# Patient Record
Sex: Female | Born: 2000 | Race: Black or African American | Hispanic: No | Marital: Single | Smoking: Never smoker
Health system: Southern US, Community
[De-identification: ages and names within clinical notes are randomized; demographics above are authoritative.]

## PROBLEM LIST (undated history)

## (undated) DIAGNOSIS — R569 Unspecified convulsions: Secondary | ICD-10-CM

## (undated) DIAGNOSIS — R599 Enlarged lymph nodes, unspecified: Secondary | ICD-10-CM

## (undated) DIAGNOSIS — D649 Anemia, unspecified: Secondary | ICD-10-CM

## (undated) DIAGNOSIS — F419 Anxiety disorder, unspecified: Secondary | ICD-10-CM

## (undated) DIAGNOSIS — T7840XA Allergy, unspecified, initial encounter: Secondary | ICD-10-CM

## (undated) DIAGNOSIS — C801 Malignant (primary) neoplasm, unspecified: Secondary | ICD-10-CM

## (undated) HISTORY — DX: Anemia, unspecified: D64.9

## (undated) HISTORY — DX: Malignant (primary) neoplasm, unspecified: C80.1

## (undated) HISTORY — DX: Allergy, unspecified, initial encounter: T78.40XA

## (undated) HISTORY — DX: Anxiety disorder, unspecified: F41.9

---
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
## 2018-10-04 DIAGNOSIS — L910 Hypertrophic scar: Secondary | ICD-10-CM | POA: Diagnosis not present

## 2018-11-06 DIAGNOSIS — L910 Hypertrophic scar: Secondary | ICD-10-CM | POA: Diagnosis not present

## 2019-02-28 DIAGNOSIS — Z113 Encounter for screening for infections with a predominantly sexual mode of transmission: Secondary | ICD-10-CM | POA: Diagnosis not present

## 2019-02-28 DIAGNOSIS — Z7189 Other specified counseling: Secondary | ICD-10-CM | POA: Diagnosis not present

## 2019-06-28 ENCOUNTER — Ambulatory Visit: Payer: Self-pay | Attending: Family

## 2019-06-28 DIAGNOSIS — Z23 Encounter for immunization: Secondary | ICD-10-CM

## 2019-06-28 NOTE — Progress Notes (Signed)
   Covid-19 Vaccination Clinic  Name:  NORELL HENNINGSON    MRN: [id number] DOB: 19-Jul-2000  06/28/2019  Ms. Eike was observed post Covid-19 immunization for 15 minutes without incident. She was provided with Vaccine Information Sheet and instruction to access the V-Safe system.   Ms. Balough was instructed to call 911 with any severe reactions post vaccine: Marland Kitchen Difficulty breathing  . Swelling of face and throat  . A fast heartbeat  . A bad rash all over body  . Dizziness and weakness   Immunizations Administered    Name Date Dose VIS Date Route   JANSSEN COVID-19 VACCINE 06/28/2019  1:20 PM 0.5 mL 05/24/2019 Intramuscular   Manufacturer: Alphonsa Overall   Lot: [id number]   [place]: [id number]

## 2019-09-26 DIAGNOSIS — L910 Hypertrophic scar: Secondary | ICD-10-CM | POA: Diagnosis not present

## 2019-11-05 DIAGNOSIS — Z20822 Contact with and (suspected) exposure to covid-19: Secondary | ICD-10-CM | POA: Diagnosis not present

## 2019-11-07 DIAGNOSIS — L910 Hypertrophic scar: Secondary | ICD-10-CM | POA: Diagnosis not present

## 2020-02-05 DIAGNOSIS — H6981 Other specified disorders of Eustachian tube, right ear: Secondary | ICD-10-CM | POA: Diagnosis not present

## 2020-02-05 DIAGNOSIS — H918X3 Other specified hearing loss, bilateral: Secondary | ICD-10-CM | POA: Diagnosis not present

## 2020-02-05 DIAGNOSIS — H938X3 Other specified disorders of ear, bilateral: Secondary | ICD-10-CM | POA: Diagnosis not present

## 2020-02-06 ENCOUNTER — Ambulatory Visit: Payer: Self-pay | Attending: Family

## 2020-02-06 DIAGNOSIS — Z23 Encounter for immunization: Secondary | ICD-10-CM

## 2020-04-14 DIAGNOSIS — Z20822 Contact with and (suspected) exposure to covid-19: Secondary | ICD-10-CM | POA: Diagnosis not present

## 2020-05-06 NOTE — Progress Notes (Signed)
   Covid-19 Vaccination Clinic  Name:  NOREENE BOREMAN    MRN: [id number] DOB: 2001/03/11  05/06/2020  Ms. Thakur was observed post Covid-19 immunization for 15 minutes without incident. She was provided with Vaccine Information Sheet and instruction to access the V-Safe system.   Ms. Hunsucker was instructed to call 911 with any severe reactions post vaccine: Marland Kitchen Difficulty breathing  . Swelling of face and throat  . A fast heartbeat  . A bad rash all over body  . Dizziness and weakness   Immunizations Administered    Name Date Dose VIS Date Route   Moderna COVID-19 Vaccine 02/06/2020 10:40 AM 0.5 mL 01/14/2020 Intramuscular   Manufacturer: Moderna   Lot: [id number]   [place]: [phone]

## 2020-08-19 ENCOUNTER — Ambulatory Visit: Payer: BC Managed Care – PPO | Admitting: Family Medicine

## 2020-08-19 ENCOUNTER — Encounter: Payer: Self-pay | Admitting: Family Medicine

## 2020-08-19 ENCOUNTER — Telehealth: Payer: Self-pay

## 2020-08-19 ENCOUNTER — Other Ambulatory Visit: Payer: Self-pay

## 2020-08-19 VITALS — BP 128/76 | HR 95 | Temp 97.3°F | Ht 67.0 in | Wt 170.4 lb

## 2020-08-19 DIAGNOSIS — R1030 Lower abdominal pain, unspecified: Secondary | ICD-10-CM | POA: Diagnosis not present

## 2020-08-19 DIAGNOSIS — D508 Other iron deficiency anemias: Secondary | ICD-10-CM | POA: Diagnosis not present

## 2020-08-19 DIAGNOSIS — Z8579 Personal history of other malignant neoplasms of lymphoid, hematopoietic and related tissues: Secondary | ICD-10-CM | POA: Diagnosis not present

## 2020-08-19 DIAGNOSIS — D509 Iron deficiency anemia, unspecified: Secondary | ICD-10-CM

## 2020-08-19 DIAGNOSIS — Z0000 Encounter for general adult medical examination without abnormal findings: Secondary | ICD-10-CM

## 2020-08-19 DIAGNOSIS — C8140 Lymphocyte-rich classical Hodgkin lymphoma, unspecified site: Secondary | ICD-10-CM

## 2020-08-19 DIAGNOSIS — Z8572 Personal history of non-Hodgkin lymphomas: Secondary | ICD-10-CM

## 2020-08-19 LAB — URINALYSIS, ROUTINE W REFLEX MICROSCOPIC
Bilirubin Urine: NEGATIVE
Hgb urine dipstick: NEGATIVE
Ketones, ur: NEGATIVE
Nitrite: NEGATIVE
Specific Gravity, Urine: 1.015 (ref 1.000–1.030)
Total Protein, Urine: NEGATIVE
Urine Glucose: NEGATIVE
Urobilinogen, UA: 0.2 (ref 0.0–1.0)
pH: 7 (ref 5.0–8.0)

## 2020-08-19 MED ORDER — PANTOPRAZOLE SODIUM 20 MG PO TBEC: 20.0000 mg | DELAYED_RELEASE_TABLET | Freq: Every day | ORAL | 0 refills | Status: DC

## 2020-08-19 NOTE — Telephone Encounter (Signed)
Patient's mother is calling in wanting to know what all is going on with her stomach issues, Anna Roman has filled out a DPR that authorizes [place] to be able to speak with mom. Mom would really like a call back today to go over this information so she understands why she is needing this medication.

## 2020-08-19 NOTE — Progress Notes (Addendum)
Established Patient Office Visit  Subjective:  Patient ID: Anna Roman, female    DOB: [birth year]  Age: 20 y.o. MRN: [id number]  CC:  Chief Complaint  Patient presents with  . Establish Care    NP/establish care concerns about stomach issues with first meal of the day. Per patient she will have major stomach pains she will sometimes have to vomit to relieve the pain symptoms x 5 years.     HPI [name] presents for history of experiencing left upper to left mid abdominal pain with her morning meal.  Occurrences been intermittent but she says more often than not.  She takes Tylenol before her meal because of the pain.  Pain is been present since she completed therapy for Hodgkin's lymphoma back in 2012.  Cannot really identify reflux of burning up into the chest.  Denies increased burp.  Denies weight loss or night sweats.  Denies that certain foods help or exacerbate.  She is not sexually active. She is a Equities trader at [employer]. Sometimes feels a little sad with current life events.   Past Medical History:  Diagnosis Date  . Allergy   . Anemia   . Lymph node enlargement    right side neck  . Seizures ([place])    x 1 as a reaction to medication; also hx. absence seizures    Past Surgical History:  Procedure Laterality Date  . lymphoma  04/15/2010   Nodular lymphocyte predominant hodgkin's lymphoma  . MASS BIOPSY  02/14/2011   Procedure: NECK MASS BIOPSY;  Surgeon: Rozetta Nunnery, MD;  Location: [place];  Service: ENT;  Laterality: Right;  excision right neck node     Family History  Problem Relation Age of Onset  . Heart disease Maternal Grandfather   . Hypertension Mother   . Hypertension Paternal Grandmother   . Diabetes Paternal Grandmother   . Hypertension Father   . Asthma Maternal Uncle   . Diabetes Maternal Grandmother   . Hypertension Maternal Grandmother     Social History   Socioeconomic History  . Marital status: Single    Spouse  name: Not on file  . Number of children: Not on file  . Years of education: Not on file  . Highest education level: Not on file  Occupational History  . Not on file  Tobacco Use  . Smoking status: Never Smoker  . Smokeless tobacco: Never Used  Vaping Use  . Vaping Use: Some days  Substance and Sexual Activity  . Alcohol use: Yes    Alcohol/week: 1.0 standard drink    Types: 1 Glasses of wine per week    Comment: social  . Drug use: No  . Sexual activity: Yes    Birth control/protection: None  Other Topics Concern  . Not on file  Social History Narrative  . Not on file   Social Determinants of Health   Financial Resource Strain: Not on file  Food Insecurity: Not on file  Transportation Needs: Not on file  Physical Activity: Not on file  Stress: Not on file  Social Connections: Not on file  Intimate Partner Violence: Not on file    Outpatient Medications Prior to Visit  Medication Sig Dispense Refill  . acetaminophen (TYLENOL) 325 MG tablet Take 650 mg by mouth every 6 (six) hours as needed.    . loratadine (CLARITIN) 10 MG tablet Take by mouth.    Marland Kitchen ibuprofen (ADVIL,MOTRIN) 400 MG tablet Take by mouth. (Patient not  taking: Reported on 08/19/2020)    . Multiple Vitamin (THERA) TABS Take by mouth. (Patient not taking: Reported on 08/19/2020)     No facility-administered medications prior to visit.    Allergies  Allergen Reactions  . Codeine Nausea And Vomiting    Other reaction(s): Confusion  . Lactose Intolerance (Gi) Other (See Comments)    Constipation, GI upset  . Azithromycin Hives    ROS Review of Systems  Constitutional: Negative for chills, diaphoresis, fatigue, fever and unexpected weight change.  HENT: Negative.   Eyes: Negative for photophobia and visual disturbance.  Respiratory: Negative.   Cardiovascular: Negative.   Gastrointestinal: Positive for abdominal pain and constipation. Negative for anal bleeding, blood in stool, diarrhea, nausea, rectal  pain and vomiting.  Endocrine: Negative for polyphagia and polyuria.  Genitourinary: Negative for difficulty urinating, dysuria and vaginal discharge.  Musculoskeletal: Negative for gait problem and joint swelling.  Skin: Negative for color change and pallor.  Neurological: Negative for speech difficulty and weakness.  Hematological: Does not bruise/bleed easily.   Depression screen [facility] 2/9 08/19/2020 08/19/2020 06/01/2017  Decreased Interest 1 0 0  Down, Depressed, Hopeless 1 1 0  PHQ - 2 Score 2 1 0  Altered sleeping 1 - -  Tired, decreased energy 0 - -  Change in appetite 0 - -  Feeling bad or failure about yourself  0 - -  Trouble concentrating 2 - -  Moving slowly or fidgety/restless 0 - -  Suicidal thoughts 0 - -  PHQ-9 Score 5 - -  Difficult doing work/chores Not difficult at all - -      Objective:    Physical Exam Vitals and nursing note reviewed.  Constitutional:      General: She is not in acute distress.    Appearance: Normal appearance. She is normal weight. She is not ill-appearing, toxic-appearing or diaphoretic.  HENT:     Head: Normocephalic and atraumatic.     Right Ear: Tympanic membrane, ear canal and external ear normal.     Left Ear: Tympanic membrane, ear canal and external ear normal.     Mouth/Throat:     Mouth: Mucous membranes are moist.     Pharynx: Oropharynx is clear. No oropharyngeal exudate or posterior oropharyngeal erythema.  Eyes:     General: No scleral icterus.       Right eye: No discharge.        Left eye: No discharge.     Extraocular Movements: Extraocular movements intact.     Conjunctiva/sclera: Conjunctivae normal.     Pupils: Pupils are equal, round, and reactive to light.  Neck:     Vascular: No carotid bruit.  Cardiovascular:     Rate and Rhythm: Normal rate and regular rhythm.  Pulmonary:     Effort: Pulmonary effort is normal.     Breath sounds: Normal breath sounds.  Abdominal:     General: Abdomen is flat. Bowel  sounds are normal. There is no distension.     Palpations: There is no mass.     Tenderness: There is no abdominal tenderness. There is no guarding or rebound.     Hernia: No hernia is present.  Musculoskeletal:     Cervical back: Neck supple. No rigidity or tenderness.  Lymphadenopathy:     Cervical: No cervical adenopathy.  Neurological:     General: No focal deficit present.     Mental Status: She is alert and oriented to person, place, and time.  Psychiatric:  Mood and Affect: Mood normal.     BP 128/76   Pulse 95   Temp (!) 97.3 F (36.3 C) (Temporal)   Ht 5\' 7"  (1.702 m)   Wt 170 lb 6.4 oz (77.3 kg)   SpO2 100%   BMI 26.69 kg/m  Wt Readings from Last 3 Encounters:  08/19/20 170 lb 6.4 oz (77.3 kg)  06/01/17 138 lb 9.6 oz (62.9 kg) (71 %, Z= 0.55)*  06/28/16 147 lb 6.4 oz (66.9 kg) (83 %, Z= 0.94)*   * Growth percentiles are based on CDC (Girls, 2-20 Years) data.     Health Maintenance Due  Topic Date Due  . HIV Screening  Never done  . HPV VACCINES (3 - 2-dose series) 10/30/2013  . Hepatitis C Screening  Never done  . PAP-Cervical Cytology Screening  Never done  . PAP SMEAR-Modifier  Never done  . TETANUS/TDAP  03/27/2020       Topic Date Due  . HPV VACCINES (3 - 2-dose series) 10/30/2013    Lab Results  Component Value Date   TSH 1.16 08/20/2020   Lab Results  Component Value Date   WBC 5.1 08/20/2020   HGB 11.1 (L) 08/20/2020   HCT 34.5 (L) 08/20/2020   MCV 68.5 Repeated and verified X2. (L) 08/20/2020   PLT 261.0 08/20/2020   Lab Results  Component Value Date   NA 136 08/20/2020   K 4.0 08/20/2020   CO2 24 08/20/2020   GLUCOSE 87 08/20/2020   BUN 10 08/20/2020   CREATININE 0.62 08/20/2020   BILITOT 0.7 08/20/2020   ALKPHOS 50 08/20/2020   AST 15 08/20/2020   ALT 9 08/20/2020   PROT 7.1 08/20/2020   ALBUMIN 4.7 08/20/2020   CALCIUM 10.0 08/20/2020   GFR 126.77 08/20/2020   No results found for: CHOL No results found for:  HDL No results found for: LDLCALC No results found for: TRIG No results found for: CHOLHDL No results found for: HGBA1C    Assessment & Plan:   Problem List Items Addressed This Visit      Other   Iron deficiency anemia secondary to inadequate dietary iron intake - Primary   Relevant Medications   ferrous sulfate 220 (44 Fe) MG/5ML solution   History of lymphoma   Relevant Orders   CBC w/Diff (Completed)   Lower abdominal pain   Relevant Medications   pantoprazole (PROTONIX) 20 MG tablet   Other Relevant Orders   US Abdomen Complete   Amylase (Completed)   CBC w/Diff (Completed)   Comprehensive metabolic panel (Completed)   Urinalysis, Routine w reflex microscopic (Completed)   Healthcare maintenance   Relevant Orders   LDL cholesterol, direct (Completed)      Meds ordered this encounter  Medications  . pantoprazole (PROTONIX) 20 MG tablet    Sig: Take 1 tablet (20 mg total) by mouth daily.    Dispense:  90 tablet    Refill:  0  . ferrous sulfate 220 (44 Fe) MG/5ML solution    Sig: Take 5 mLs (220 mg total) by mouth daily.    Dispense:  150 mL    Refill:  3    Follow-up: Return in about 6 weeks (around 09/30/2020).   Trial of daily Protonix over the next 6 weeks.  Have ordered abdominal ultrasound.  She will follow-up in 6 weeks. Not interested in pursuing treatment for her sadness at this time.  Libby Maw, MD

## 2020-08-19 NOTE — Addendum Note (Signed)
Addended by: Beryle Lathe S on: 08/19/2020 02:08 PM   Modules accepted: Orders

## 2020-08-20 ENCOUNTER — Other Ambulatory Visit: Payer: Self-pay

## 2020-08-20 ENCOUNTER — Other Ambulatory Visit (INDEPENDENT_AMBULATORY_CARE_PROVIDER_SITE_OTHER): Payer: BC Managed Care – PPO

## 2020-08-20 DIAGNOSIS — Z8579 Personal history of other malignant neoplasms of lymphoid, hematopoietic and related tissues: Secondary | ICD-10-CM | POA: Diagnosis not present

## 2020-08-20 DIAGNOSIS — Z0000 Encounter for general adult medical examination without abnormal findings: Secondary | ICD-10-CM

## 2020-08-20 DIAGNOSIS — D509 Iron deficiency anemia, unspecified: Secondary | ICD-10-CM | POA: Diagnosis not present

## 2020-08-20 DIAGNOSIS — R1030 Lower abdominal pain, unspecified: Secondary | ICD-10-CM

## 2020-08-20 LAB — COMPREHENSIVE METABOLIC PANEL
ALT: 9 U/L (ref 0–35)
AST: 15 U/L (ref 0–37)
Albumin: 4.7 g/dL (ref 3.5–5.2)
Alkaline Phosphatase: 50 U/L (ref 39–117)
BUN: 10 mg/dL (ref 6–23)
CO2: 24 mEq/L (ref 19–32)
Calcium: 10 mg/dL (ref 8.4–10.5)
Chloride: 102 mEq/L (ref 96–112)
Creatinine, Ser: 0.62 mg/dL (ref 0.40–1.20)
GFR: 126.77 mL/min (ref >60.00)
Glucose, Bld: 87 mg/dL (ref 70–99)
Potassium: 4 mEq/L (ref 3.5–5.1)
Sodium: 136 mEq/L (ref 135–145)
Total Bilirubin: 0.7 mg/dL (ref 0.2–1.2)
Total Protein: 7.1 g/dL (ref 6.0–8.3)

## 2020-08-20 LAB — CBC WITH DIFFERENTIAL/PLATELET
Basophils Absolute: 0 10*3/uL (ref 0.0–0.1)
Basophils Relative: 0.9 % (ref 0.0–3.0)
Eosinophils Absolute: 0.1 10*3/uL (ref 0.0–0.7)
Eosinophils Relative: 1.4 % (ref 0.0–5.0)
HCT: 34.5 % — ABNORMAL LOW (ref 36.0–46.0)
Hemoglobin: 11.1 g/dL — ABNORMAL LOW (ref 12.0–15.0)
Lymphocytes Relative: 37.9 % (ref 12.0–46.0)
Lymphs Abs: 1.9 10*3/uL (ref 0.7–4.0)
MCHC: 32.2 g/dL (ref 30.0–36.0)
MCV: 68.5 fl — ABNORMAL LOW (ref 78.0–100.0)
Monocytes Absolute: 0.5 10*3/uL (ref 0.1–1.0)
Monocytes Relative: 9.3 % (ref 3.0–12.0)
Neutro Abs: 2.6 10*3/uL (ref 1.4–7.7)
Neutrophils Relative %: 50.5 % (ref 43.0–77.0)
Platelets: 261 10*3/uL (ref 150.0–400.0)
RBC: 5.08 Mil/uL (ref 3.87–5.11)
RDW: 18.8 % — ABNORMAL HIGH (ref 11.5–15.5)
WBC: 5.1 10*3/uL (ref 4.0–10.5)

## 2020-08-20 LAB — AMYLASE: Amylase: 61 U/L (ref 27–131)

## 2020-08-20 LAB — TSH: TSH: 1.16 u[IU]/mL (ref 0.35–4.50)

## 2020-08-20 LAB — LDL CHOLESTEROL, DIRECT: Direct LDL: 102 mg/dL

## 2020-08-20 NOTE — Progress Notes (Signed)
Per orders of Dr. Ethelene Hal pt is here for labs pt tolerated draw well.

## 2020-08-21 LAB — IRON,TIBC AND FERRITIN PANEL
%SAT: 7 % (calc) — ABNORMAL LOW (ref 16–45)
Ferritin: 4 ng/mL — ABNORMAL LOW (ref 16–154)
Iron: 33 ug/dL — ABNORMAL LOW (ref 40–190)
TIBC: 478 mcg/dL (calc) — ABNORMAL HIGH (ref 250–450)

## 2020-08-24 DIAGNOSIS — Z0000 Encounter for general adult medical examination without abnormal findings: Secondary | ICD-10-CM | POA: Insufficient documentation

## 2020-08-24 MED ORDER — FERROUS SULFATE 220 (44 FE) MG/5ML PO ELIX: 220.0000 mg | ORAL_SOLUTION | Freq: Every day | ORAL | 3 refills | Status: AC

## 2020-08-24 NOTE — Addendum Note (Signed)
Addended by: Jon Billings on: 08/24/2020 07:38 AM   Modules accepted: Orders

## 2020-08-31 ENCOUNTER — Telehealth: Payer: Self-pay | Admitting: Family Medicine

## 2020-08-31 NOTE — Telephone Encounter (Signed)
I received a message from [place] that pt had not called back to schedule US Abdomen complete. I called pt to ask if she still wanted exam. Pt gave phone to her mother, [name].   [name] states she has called office 2x (see 1 msg below) asking for someone to call back.   [name] states that pt had cancer and was treated at [facility] in [place] from 2011-2015. She doesn't want to look at 1 body part at a time. She is requesting full body MRI to be ordered at [facility] in [name].  [name] would like call back today.  Ray,Anna Roman (Mother) [phone] Jerilynn Mages)

## 2020-08-31 NOTE — Telephone Encounter (Signed)
We don't really do "full body Mris" Perhaps the best thing to do would be for me to refer her back to her oncologist for a check up.

## 2020-08-31 NOTE — Telephone Encounter (Signed)
error 

## 2020-09-01 NOTE — Telephone Encounter (Signed)
Please advise message below mother requesting referral with oncologist that works for [facility] in [place]. Okay for referral?

## 2020-09-01 NOTE — Telephone Encounter (Signed)
Spoke with patients mother regarding referral for oncologist per Audrea Muscat patient was diagnosed with Non Hodkins lymphoma at 20 years of age. Patients mother wanted to also inform Dr. Ethelene Hal and let you know that patient was still taking medications that were prescribed at her last visit. Mother asked if she needed to continue taking Protonix I checked last note and seen that the recommendation was to take for 6 weeks and come back for follow up (appointment scheduled) mother aware that patient should take meds until next visit.

## 2020-09-05 NOTE — Addendum Note (Signed)
Addended by: Jon Billings on: 09/05/2020 09:35 AM   Modules accepted: Orders

## 2020-09-07 ENCOUNTER — Encounter: Payer: Self-pay | Admitting: *Deleted

## 2020-09-07 ENCOUNTER — Telehealth: Payer: Self-pay | Admitting: Family Medicine

## 2020-09-07 NOTE — Progress Notes (Signed)
Patient has a history of pediatric Hodgkins Lymphoma. She has not followed up with her oncologist for several years, and is now an adult and needs a new oncologist. She lives with her mother, who is on her ROI.   Attempted to reach patient at 11:00am. Voicemail left requesting call back. Patient called back at 1:35p.  Reached out to [employer] to introduce myself as the office RN Navigator and explain our new patient process. Reviewed the reason for their referral and scheduled their new patient appointment along with labs. Provided address and directions to the office including call back phone number. Reviewed with patient any concerns they may have or any possible barriers to attending their appointment.   Informed patient about my role as a navigator and that I will meet with them prior to their New Patient appointment and more fully discuss what services I can provide. At this time patient has no further questions or needs.    Oncology Nurse Navigator Documentation  Oncology Nurse Navigator Flowsheets 09/07/2020  Diagnosis Status Confirmed Diagnosis Complete  Navigator Follow Up Date: 09/15/2020  Navigator Follow Up Reason: New Patient Appointment  Navigator Location [facility]  Referral Date to RadOnc/MedOnc 09/06/2020  Navigator Encounter Type Introductory Phone Call  Patient Visit Type MedOnc  Treatment Phase Post-Tx Follow-up  Barriers/Navigation Needs Coordination of Care;Education  Education Other  Interventions Coordination of Care;Education  Acuity Level 2-Minimal Needs (1-2 Barriers Identified)  Coordination of Care Appts  Education Method Other  Support Groups/Services Friends and Family  Time Spent with Patient 36

## 2020-09-07 NOTE — Telephone Encounter (Signed)
Pt's mom(Mary Lelon Frohlich) is wanting a call concerning her daughter's referral to [facility] oncology. She has many questions. Someone called her daughter and she does not understand the issue. This is what Audrea Muscat said. Please advise mom at [phone].

## 2020-09-09 NOTE — Telephone Encounter (Signed)
Mother aware of message below.

## 2020-09-09 NOTE — Telephone Encounter (Signed)
Mother calling for clarification on patient next appointments. Went over this with mother. No other concerns at this time.

## 2020-09-15 ENCOUNTER — Inpatient Hospital Stay: Payer: BC Managed Care – PPO | Attending: Hematology & Oncology

## 2020-09-15 ENCOUNTER — Other Ambulatory Visit: Payer: Self-pay

## 2020-09-15 ENCOUNTER — Encounter: Payer: Self-pay | Admitting: Hematology & Oncology

## 2020-09-15 ENCOUNTER — Inpatient Hospital Stay: Payer: BC Managed Care – PPO | Admitting: Hematology & Oncology

## 2020-09-15 ENCOUNTER — Encounter: Payer: Self-pay | Admitting: *Deleted

## 2020-09-15 VITALS — BP 123/78 | HR 93 | Temp 97.5°F | Resp 20 | Wt 175.0 lb

## 2020-09-15 DIAGNOSIS — Z8249 Family history of ischemic heart disease and other diseases of the circulatory system: Secondary | ICD-10-CM | POA: Diagnosis not present

## 2020-09-15 DIAGNOSIS — Z8571 Personal history of Hodgkin lymphoma: Secondary | ICD-10-CM | POA: Insufficient documentation

## 2020-09-15 DIAGNOSIS — Z833 Family history of diabetes mellitus: Secondary | ICD-10-CM

## 2020-09-15 DIAGNOSIS — R1110 Vomiting, unspecified: Secondary | ICD-10-CM | POA: Diagnosis not present

## 2020-09-15 DIAGNOSIS — R109 Unspecified abdominal pain: Secondary | ICD-10-CM | POA: Insufficient documentation

## 2020-09-15 DIAGNOSIS — D649 Anemia, unspecified: Secondary | ICD-10-CM

## 2020-09-15 DIAGNOSIS — Z9221 Personal history of antineoplastic chemotherapy: Secondary | ICD-10-CM | POA: Insufficient documentation

## 2020-09-15 DIAGNOSIS — Z79899 Other long term (current) drug therapy: Secondary | ICD-10-CM | POA: Insufficient documentation

## 2020-09-15 DIAGNOSIS — C8141 Lymphocyte-rich classical Hodgkin lymphoma, lymph nodes of head, face, and neck: Secondary | ICD-10-CM

## 2020-09-15 LAB — CBC WITH DIFFERENTIAL (CANCER CENTER ONLY)
Abs Immature Granulocytes: 0.01 10*3/uL (ref 0.00–0.07)
Basophils Absolute: 0 10*3/uL (ref 0.0–0.1)
Basophils Relative: 1 %
Eosinophils Absolute: 0.1 10*3/uL (ref 0.0–0.5)
Eosinophils Relative: 1 %
HCT: 37.3 % (ref 36.0–46.0)
Hemoglobin: 11.2 g/dL — ABNORMAL LOW (ref 12.0–15.0)
Immature Granulocytes: 0 %
Lymphocytes Relative: 35 %
Lymphs Abs: 1.8 10*3/uL (ref 0.7–4.0)
MCH: 22 pg — ABNORMAL LOW (ref 26.0–34.0)
MCHC: 30 g/dL (ref 30.0–36.0)
MCV: 73.4 fL — ABNORMAL LOW (ref 80.0–100.0)
Monocytes Absolute: 0.3 10*3/uL (ref 0.1–1.0)
Monocytes Relative: 7 %
Neutro Abs: 2.9 10*3/uL (ref 1.7–7.7)
Neutrophils Relative %: 56 %
Platelet Count: 253 10*3/uL (ref 150–400)
RBC: 5.08 MIL/uL (ref 3.87–5.11)
RDW: 20.2 % — ABNORMAL HIGH (ref 11.5–15.5)
WBC Count: 5.1 10*3/uL (ref 4.0–10.5)
nRBC: 0 % (ref 0.0–0.2)

## 2020-09-15 LAB — CMP (CANCER CENTER ONLY)
ALT: 16 U/L (ref 0–44)
AST: 18 U/L (ref 15–41)
Albumin: 4.5 g/dL (ref 3.5–5.0)
Alkaline Phosphatase: 42 U/L (ref 38–126)
Anion gap: 7 (ref 5–15)
BUN: 14 mg/dL (ref 6–20)
CO2: 28 mmol/L (ref 22–32)
Calcium: 10 mg/dL (ref 8.9–10.3)
Chloride: 102 mmol/L (ref 98–111)
Creatinine: 0.65 mg/dL (ref 0.44–1.00)
GFR, Estimated: >60 mL/min (ref >60)
Glucose, Bld: 114 mg/dL — ABNORMAL HIGH (ref 70–99)
Potassium: 4.3 mmol/L (ref 3.5–5.1)
Sodium: 137 mmol/L (ref 135–145)
Total Bilirubin: 0.5 mg/dL (ref 0.3–1.2)
Total Protein: 6.9 g/dL (ref 6.5–8.1)

## 2020-09-15 LAB — LACTATE DEHYDROGENASE: LDH: 125 U/L (ref 98–192)

## 2020-09-15 NOTE — Progress Notes (Signed)
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  Treated with 3 cycles of chemotherapy with AVPC.  Chief Complaint  Patient presents with   New Patient (Initial Visit)    "Need to look at my spleen."  : My family doctor made an appointment for me.  HPI: Anna Roman is a very nice 20 year old African-American female.  She is incredibly interesting.  She goes to [employer].  She is in public relations.  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  She has she likes metal music.  I am absolutely impressed by this.  I cannot imagine or last time I saw a 20 year old woman who liked metal music.  Her history goes back to 2012 when she was diagnosed with lymphocyte predominant Hodgkin's lymphoma.  She had what was appear to be stage IIa disease.  She had a lymph node resected.  She was treated with chemotherapy for 3 cycles.  She only got 3 cycles because of side effects of treatment.  She received treatment with AVPC.  Recently, has been having abdominal issues.  He still having problems the morning.  She is having some vomiting.  She has been having some abdominal pain.  She saw her family doctor.  He ordered an ultrasound of her abdomen.  This had not been done yet.Marland Kitchen  He also felt that she needed to see oncology because of the history of Hodgkin's disease.  She has not seen her oncologist probably for about 7 years.  She has had no other surgeries.  There is no problems with her bowels or bladder.  She does not had diarrhea.  Has been no fever.  She has had no night sweats.  There is been no issues with monthly cycles.  She is had no problems with cough or shortness of breath.  She does not smoke.  She rarely has an alcoholic beverage.  There is no history in the family of cancer.  Overall, her performance status is ECOG 0.  Gastric    Past Medical History:  Diagnosis Date    Allergy    Anemia    Lymph node enlargement    right side neck   Seizures ([place])    x 1 as a reaction to medication; also hx. absence seizures  :   Past Surgical History:  Procedure Laterality Date   lymphoma  04/15/2010   Nodular lymphocyte predominant hodgkin's lymphoma   MASS BIOPSY  02/14/2011   Procedure: NECK MASS BIOPSY;  Surgeon: Rozetta Nunnery, MD;  Location: [place];  Service: ENT;  Laterality: Right;  excision right neck node   :   Current Outpatient Medications:    acetaminophen (TYLENOL) 325 MG tablet, Take 650 mg by mouth every 6 (six) hours as needed., Disp: , Rfl:    ferrous sulfate 220 (44 Fe) MG/5ML solution, Take 5 mLs (220 mg total) by mouth daily., Disp: 150 mL, Rfl: 3   loratadine (CLARITIN) 10 MG tablet, Take by mouth daily as needed., Disp: , Rfl:    pantoprazole (PROTONIX) 20 MG tablet, Take 1 tablet (20 mg total) by mouth daily., Disp: 90 tablet, Rfl: 0:  :   Allergies  Allergen Reactions   Codeine Nausea And Vomiting    Other reaction(s): Confusion   Lactose Intolerance (Gi) Other (See Comments)    Constipation, GI upset   Azithromycin Hives  :   Family  History  Problem Relation Age of Onset   Heart disease Maternal Grandfather    Hypertension Mother    Hypertension Paternal Grandmother    Diabetes Paternal Grandmother    Hypertension Father    Asthma Maternal Uncle    Diabetes Maternal Grandmother    Hypertension Maternal Grandmother   :   Social History   Socioeconomic History   Marital status: Single    Spouse name: Not on file   Number of children: Not on file   Years of education: Not on file   Highest education level: Not on file  Occupational History   Not on file  Tobacco Use   Smoking status: Never   Smokeless tobacco: Never  Vaping Use   Vaping Use: Former  Substance and Sexual Activity   Alcohol use: Yes    Alcohol/week: 1.0 standard drink    Types: 1 Glasses of wine per week    Comment:  social   Drug use: No   Sexual activity: Yes    Birth control/protection: None  Other Topics Concern   Not on file  Social History Narrative   Not on file   Social Determinants of Health   Financial Resource Strain: Not on file  Food Insecurity: Not on file  Transportation Needs: Not on file  Physical Activity: Not on file  Stress: Not on file  Social Connections: Not on file  Intimate Partner Violence: Not on file  :  Review of Systems  Constitutional: Negative.   HENT: Negative.    Eyes: Negative.   Respiratory: Negative.    Cardiovascular: Negative.   Gastrointestinal:  Positive for abdominal pain and vomiting.  Genitourinary: Negative.   Musculoskeletal: Negative.   Skin: Negative.   Neurological: Negative.   Endo/Heme/Allergies: Negative.   Psychiatric/Behavioral: Negative.      Exam:  This is a well-developed and well-nourished African-American female in no obvious distress.  Vital signs show temperature of 97.5.  Pulse is 93.  Blood pressure 123/78.  Weight is 175 pounds.  Head neck exam shows no ocular or oral lesions.  She has no palpable cervical or supraclavicular lymph nodes.  Lungs are clear bilaterally.  Cardiac exam regular rate and rhythm with no murmurs, rubs or bruits.  Abdomen is soft.  She has good bowel sounds.  There is no guarding or rebound tenderness.  She has no palpable abdominal mass.  There is no fluid wave.  There is no palpable liver or spleen tip.  Back exam shows no tenderness over the spine, ribs or hips.  Extremities shows no clubbing, cyanosis or edema.  Neurological exam shows no focal neurological deficits.  Skin exam shows no rashes, ecchymoses or petechia.   @IPVITALS @    Recent Labs    09/15/20 1036  WBC 5.1  HGB 11.2*  HCT 37.3  PLT 253    Recent Labs    09/15/20 1036  NA 137  K 4.3  CL 102  CO2 28  GLUCOSE 114*  BUN 14  CREATININE 0.65  CALCIUM 10.0    Blood smear review: None  Pathology:  None    Assessment and Plan: Anna Roman is a very nice 20 year old African-American female.  She has a past history of early-stage lymphocyte predominant Hodgkin's lymphoma.  This was treated with chemotherapy.  I have to believe that this is not a problem for her.  I would think that she is cured.   I am not sure what to make of this abdominal pain.  I  really have to doubt that this is anything related to her past history of Hodgkin's or to her past treatments.  Am surprised if she not been referred to Gastroenterology.  I would get an ultrasound of her abdomen.  We will definitely look at her spleen.  We will also look at her gallbladder.  I do not see that we have to do a PET scan on her.  She is iron deficient.  She is taking oral iron liquid supplement.  I would have her continue this.  As nice as she is, I just do not think we have to get her back.  We will get her back if there is a problem on her ultrasound.  I had a great time talking with Anna Roman and her mom.  They are both wonderful women and they were fun to talk to.

## 2020-09-15 NOTE — Progress Notes (Signed)
Initial RN Navigator Patient Visit  Name: Anna Roman Date of Referral : 09/06/2020 Diagnosis: History of pediatric Hodgkins Lymphoma  Met with patient and her mother prior to their visit with MD. Provided with  contact information for myself and the office. Also gave patient MD and Navigator business card. Reviewed with patient the general overview of expected course after initial diagnosis and time frame for all steps to be completed.  Patient's mom had several questions including when she would be getting an [place]. Reviewed chart with them, and orders. Patient had an [place] ordered by her primary care, however they cancelled it after not being able to contact patient. Mother requests that her number be the primary number in patient chart. [name] confirmed to have her mom's number as the primary contact.   Patient has already completed treatment about 10 years ago, and is coming to this office for continued surveillance.   Patient completed visit with Dr. Marin Olp. She will need an [place]. This has already been scheduled for 09/20/2020. Depending on results, we will schedule follow up.  Patient understands all follow up procedures and expectations. They have my number to reach out for any further clarification or additional needs.   Oncology Nurse Navigator Documentation  Oncology Nurse Navigator Flowsheets 09/15/2020  Diagnosis Status -  Navigator Follow Up Date: 09/20/2020  Navigator Follow Up Reason: Scan Review  Navigator Location [facility]  Referral Date to RadOnc/MedOnc -  Navigator Encounter Type Initial MedOnc  Patient Visit Type MedOnc  Treatment Phase Post-Tx Follow-up  Barriers/Navigation Needs Coordination of Care;Education  Education Other  Interventions Education;Psycho-Social Support  Acuity Level 2-Minimal Needs (1-2 Barriers Identified)  Coordination of Care -  Education Method Verbal  Support Groups/Services Friends and Family  Time Spent with Patient 35

## 2020-09-16 ENCOUNTER — Telehealth: Payer: Self-pay

## 2020-09-16 NOTE — Telephone Encounter (Signed)
No 09/15/20 los noted/sent   [employer]

## 2020-09-20 ENCOUNTER — Ambulatory Visit (HOSPITAL_BASED_OUTPATIENT_CLINIC_OR_DEPARTMENT_OTHER)
Admission: RE | Admit: 2020-09-20 | Discharge: 2020-09-20 | Disposition: A | Discharge status: Home / Self Care | Payer: BC Managed Care – PPO | Source: Ambulatory Visit | Attending: Hematology & Oncology | Admitting: Hematology & Oncology

## 2020-09-20 ENCOUNTER — Other Ambulatory Visit: Payer: Self-pay

## 2020-09-20 ENCOUNTER — Telehealth: Payer: Self-pay | Admitting: *Deleted

## 2020-09-20 DIAGNOSIS — R109 Unspecified abdominal pain: Secondary | ICD-10-CM | POA: Diagnosis not present

## 2020-09-20 DIAGNOSIS — C8141 Lymphocyte-rich classical Hodgkin lymphoma, lymph nodes of head, face, and neck: Secondary | ICD-10-CM | POA: Insufficient documentation

## 2020-09-20 NOTE — Telephone Encounter (Addendum)
-----   Message from Volanda Napoleon, MD sent at 09/20/2020 12:53 PM EDT ----- Called patient to let her know that the ultrasound is normal.  There is no enlarged spleen.  Liver looks fine.  There is no swollen lymph nodes that would suggest Hodgkin's disease.  Her gallbladder also is okay.  Phone went to voicemail.  LEft message for patient to call us back for information on scan.

## 2020-09-21 ENCOUNTER — Encounter: Payer: Self-pay | Admitting: *Deleted

## 2020-09-21 NOTE — Telephone Encounter (Signed)
Informed patient that [place] is normal.  She verbalized understanding.

## 2020-09-21 NOTE — Progress Notes (Signed)
Patient [place] normal. No evidence for any Hodgkins recurrence. Navigation not needed.   Oncology Nurse Navigator Documentation  Oncology Nurse Navigator Flowsheets 09/21/2020  Diagnosis Status -  Navigator Follow Up Date: -  Navigator Follow Up Reason: -  Navigation Complete Date: 09/21/2020  Post Navigation: Continue to Follow Patient? No  Reason Not Navigating Patient: No Treatment, Observation Only  Navigator Location [facility]  Referral Date to RadOnc/MedOnc -  Navigator Encounter Type Scan Review  Patient Visit Type MedOnc  Treatment Phase Post-Tx Follow-up  Barriers/Navigation Needs Coordination of Care;Education  Education -  Interventions None Required  Acuity Level 2-Minimal Needs (1-2 Barriers Identified)  Coordination of Care -  Education Method -  Support Groups/Services Friends and Family  Time Spent with Patient 15

## 2020-09-30 ENCOUNTER — Ambulatory Visit: Payer: BC Managed Care – PPO | Admitting: Family Medicine

## 2020-10-13 ENCOUNTER — Other Ambulatory Visit: Payer: Self-pay

## 2020-10-14 ENCOUNTER — Ambulatory Visit: Payer: BC Managed Care – PPO | Admitting: Family Medicine

## 2020-10-14 ENCOUNTER — Encounter: Payer: Self-pay | Admitting: Family Medicine

## 2020-10-14 VITALS — BP 122/68 | HR 104 | Temp 97.9°F | Ht 67.0 in | Wt 179.2 lb

## 2020-10-14 DIAGNOSIS — D509 Iron deficiency anemia, unspecified: Secondary | ICD-10-CM

## 2020-10-14 DIAGNOSIS — R1033 Periumbilical pain: Secondary | ICD-10-CM | POA: Diagnosis not present

## 2020-10-14 NOTE — Progress Notes (Signed)
Established Patient Office Visit  Subjective:  Patient ID: Anna Roman, female    DOB: 03-15-2001  Age: 20 y.o. MRN: [id number]  CC:  Chief Complaint  Patient presents with   Follow-up    Follow up on stomach issues would like referral to GI.     HPI Anna Roman presents for her persisting abdominal pain.  Patient describes the pain as being located in the mid abdominal area.  It occurs after her morning meal.  It is relieved when she vomits.  She stools 2-4 times weekly.  This is her usual pattern.  Stools are formed and she denies diarrhea or hematochezia.  No weight loss.  She was given a trial of pantoprazole and said that it did not seem to help.  She reports compliance with iron therapy.  She was seen by hematology through her request and follow-up hemoglobin was slightly improved.  She says that she feels better taking iron pills.  Labs have been normal.  Abdominal ultrasound was normal.  She acknowledges that this pain is different from her menstrual pain.    Past Medical History:  Diagnosis Date   Allergy    Anemia    Lymph node enlargement    right side neck   Seizures ([place])    x 1 as a reaction to medication; also hx. absence seizures    Past Surgical History:  Procedure Laterality Date   lymphoma  04/15/2010   Nodular lymphocyte predominant hodgkin's lymphoma   MASS BIOPSY  02/14/2011   Procedure: NECK MASS BIOPSY;  Surgeon: Rozetta Nunnery, MD;  Location: [facility];  Service: ENT;  Laterality: Right;  excision right neck node     Family History  Problem Relation Age of Onset   Heart disease Maternal Grandfather    Hypertension Mother    Hypertension Paternal Grandmother    Diabetes Paternal Grandmother    Hypertension Father    Asthma Maternal Uncle    Diabetes Maternal Grandmother    Hypertension Maternal Grandmother     Social History   Socioeconomic History   Marital status: Single    Spouse name: Not on file   Number  of children: Not on file   Years of education: Not on file   Highest education level: Not on file  Occupational History   Not on file  Tobacco Use   Smoking status: Never   Smokeless tobacco: Never  Vaping Use   Vaping Use: Former  Substance and Sexual Activity   Alcohol use: Yes    Alcohol/week: 1.0 standard drink    Types: 1 Glasses of wine per week    Comment: social   Drug use: No   Sexual activity: Yes    Birth control/protection: None  Other Topics Concern   Not on file  Social History Narrative   Not on file   Social Determinants of Health   Financial Resource Strain: Not on file  Food Insecurity: Not on file  Transportation Needs: Not on file  Physical Activity: Not on file  Stress: Not on file  Social Connections: Not on file  Intimate Partner Violence: Not on file    Outpatient Medications Prior to Visit  Medication Sig Dispense Refill   acetaminophen (TYLENOL) 325 MG tablet Take 650 mg by mouth every 6 (six) hours as needed.     ferrous sulfate 220 (44 Fe) MG/5ML solution Take 5 mLs (220 mg total) by mouth daily. 150 mL 3   pantoprazole (PROTONIX)  20 MG tablet Take 1 tablet (20 mg total) by mouth daily. 90 tablet 0   loratadine (CLARITIN) 10 MG tablet Take by mouth daily as needed. (Patient not taking: Reported on 10/14/2020)     No facility-administered medications prior to visit.    Allergies  Allergen Reactions   Codeine Nausea And Vomiting    Other reaction(s): Confusion   Lactose Intolerance (Gi) Other (See Comments)    Constipation, GI upset   Azithromycin Hives    ROS Review of Systems  Constitutional: Negative.   HENT: Negative.    Respiratory: Negative.    Gastrointestinal:  Positive for abdominal pain and vomiting.  Genitourinary: Negative.      Objective:    Physical Exam Vitals and nursing note reviewed.  Constitutional:      General: She is not in acute distress.    Appearance: Normal appearance. She is not ill-appearing,  toxic-appearing or diaphoretic.  HENT:     Right Ear: External ear normal.     Left Ear: External ear normal.     Mouth/Throat:     Mouth: Mucous membranes are dry.     Pharynx: Oropharynx is clear. No oropharyngeal exudate or posterior oropharyngeal erythema.  Eyes:     General:        Right eye: No discharge.        Left eye: No discharge.     Extraocular Movements: Extraocular movements intact.     Conjunctiva/sclera: Conjunctivae normal.     Pupils: Pupils are equal, round, and reactive to light.  Cardiovascular:     Rate and Rhythm: Normal rate and regular rhythm.  Pulmonary:     Effort: Pulmonary effort is normal.     Breath sounds: Normal breath sounds.  Abdominal:     General: Abdomen is flat. Bowel sounds are normal. There is no distension.     Palpations: Abdomen is soft. There is no mass.     Tenderness: There is no abdominal tenderness. There is no guarding or rebound.     Hernia: No hernia is present.  Musculoskeletal:     Cervical back: No rigidity or tenderness.  Lymphadenopathy:     Cervical: No cervical adenopathy.  Neurological:     Mental Status: She is alert and oriented to person, place, and time.  Psychiatric:        Mood and Affect: Mood normal.        Behavior: Behavior normal.    BP 122/68   Pulse (!) 104   Temp 97.9 F (36.6 C) (Temporal)   Ht 5\' 7"  (1.702 m)   Wt 179 lb 3.2 oz (81.3 kg)   SpO2 98%   BMI 28.07 kg/m  Wt Readings from Last 3 Encounters:  10/14/20 179 lb 3.2 oz (81.3 kg)  09/15/20 175 lb (79.4 kg)  08/19/20 170 lb 6.4 oz (77.3 kg)     Health Maintenance Due  Topic Date Due   Pneumococcal Vaccine 104-1 Years old (1 - PCV) Never done   HIV Screening  Never done   HPV VACCINES (3 - 2-dose series) 10/30/2013   Hepatitis C Screening  Never done   PAP-Cervical Cytology Screening  Never done   PAP SMEAR-Modifier  Never done   TETANUS/TDAP  03/27/2020       Topic Date Due   HPV VACCINES (3 - 2-dose series) 10/30/2013     Lab Results  Component Value Date   TSH 1.16 08/20/2020   Lab Results  Component Value Date  WBC 5.1 09/15/2020   HGB 11.2 (L) 09/15/2020   HCT 37.3 09/15/2020   MCV 73.4 (L) 09/15/2020   PLT 253 09/15/2020   Lab Results  Component Value Date   NA 137 09/15/2020   K 4.3 09/15/2020   CO2 28 09/15/2020   GLUCOSE 114 (H) 09/15/2020   BUN 14 09/15/2020   CREATININE 0.65 09/15/2020   BILITOT 0.5 09/15/2020   ALKPHOS 42 09/15/2020   AST 18 09/15/2020   ALT 16 09/15/2020   PROT 6.9 09/15/2020   ALBUMIN 4.5 09/15/2020   CALCIUM 10.0 09/15/2020   ANIONGAP 7 09/15/2020   GFR 126.77 08/20/2020   No results found for: CHOL No results found for: HDL No results found for: LDLCALC No results found for: TRIG No results found for: CHOLHDL No results found for: HGBA1C    Assessment & Plan:   Problem List Items Addressed This Visit       Other   Iron deficiency anemia - Primary   Periumbilical abdominal pain   Relevant Orders   Ambulatory referral to Gastroenterology    No orders of the defined types were placed in this encounter.   Follow-up: Return in about 3 months (around 01/14/2021), or continue iron tablet daily. follow up with Gyn doctor as planned.Libby Maw, MD

## 2020-11-18 DIAGNOSIS — Z113 Encounter for screening for infections with a predominantly sexual mode of transmission: Secondary | ICD-10-CM | POA: Diagnosis not present

## 2020-11-18 DIAGNOSIS — Z01419 Encounter for gynecological examination (general) (routine) without abnormal findings: Secondary | ICD-10-CM | POA: Diagnosis not present

## 2020-11-18 DIAGNOSIS — Z1151 Encounter for screening for human papillomavirus (HPV): Secondary | ICD-10-CM | POA: Diagnosis not present

## 2021-01-17 ENCOUNTER — Ambulatory Visit: Payer: BC Managed Care – PPO | Admitting: Family Medicine

## 2021-04-11 ENCOUNTER — Encounter: Payer: Self-pay | Admitting: Family Medicine

## 2021-08-03 ENCOUNTER — Encounter: Payer: Self-pay | Admitting: Gastroenterology

## 2021-08-31 ENCOUNTER — Ambulatory Visit: Payer: BC Managed Care – PPO | Admitting: Gastroenterology

## 2021-08-31 ENCOUNTER — Encounter: Payer: Self-pay | Admitting: Gastroenterology

## 2021-08-31 VITALS — BP 120/84 | HR 110 | Ht 67.0 in | Wt 174.5 lb

## 2021-08-31 DIAGNOSIS — Z8571 Personal history of Hodgkin lymphoma: Secondary | ICD-10-CM

## 2021-08-31 DIAGNOSIS — R112 Nausea with vomiting, unspecified: Secondary | ICD-10-CM

## 2021-08-31 DIAGNOSIS — R1013 Epigastric pain: Secondary | ICD-10-CM

## 2021-08-31 MED ORDER — PANTOPRAZOLE SODIUM 40 MG PO TBEC: 40.0000 mg | DELAYED_RELEASE_TABLET | Freq: Every day | ORAL | 0 refills | Status: DC

## 2021-08-31 NOTE — Progress Notes (Signed)
Chief Complaint: epi pain  Referring Provider:  Libby Maw,*      ASSESSMENT AND PLAN;   #1.  Epi pain with occ N/V. Neg [place] 08/2020. Nl CBC, CMP, amylase  #2.  Chronic constipation.    #3.  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 -D/W mom  HPI:    Anna Roman is a 21 y.o. female  Student at [employer], plays drums Accompanied by her mom H/O lymphocyte predominant Hodgkin's lymphoma Dx 2012, treated with 3 cycles of chemo with AVPC (cured)  C/O Epi pain x 7-8 years. Pain  in the morning with 1st meal, occ vomiting, occ nausea. Better with Tylenol which she takes every day.  She could not definitely identify any food triggers.  No heartburn.  Had negative ultrasound Abdo previously- no splenomegaly 08/2020.  Advised to get EGD.  Also with longstanding constipation.  She was also constipated as a child per her mom, req miralax. BM 1/3 days.  No abdominal discomfort, bloating or abdominal pain.  She does drink plenty of water.  No melena or hematochezia.  Has had heavy menstrual cycles with clots, lasting approximately 1 week. Dx with IDA d/t menorrhagia.  Has appointment with GYN coming up.  No family history of colon cancer or colon polyps.  Past GI work-up [place] abdo 09/20/2020: neg. No splenomegaly. Past Medical History:  Diagnosis Date   Allergy    Anemia    Lymph node enlargement    right side neck   Seizures ([place])    x 1 as a reaction to medication; also hx. absence seizures    Past Surgical History:  Procedure Laterality Date   lymphoma  04/15/2010   Nodular lymphocyte predominant hodgkin's lymphoma   MASS BIOPSY  02/14/2011   Procedure: NECK MASS BIOPSY;  Surgeon: Rozetta Nunnery, MD;  Location: [facility];   Service: ENT;  Laterality: Right;  excision right neck node     Family History  Problem Relation Age of Onset   Hypertension Mother    Hypertension Father    Diabetes Maternal Grandmother    Hypertension Maternal Grandmother    Heart disease Maternal Grandfather    Hypertension Paternal Grandmother    Diabetes Paternal Grandmother    Asthma Maternal Uncle    Colon cancer Neg Hx    Rectal cancer Neg Hx    Stomach cancer Neg Hx     Social History   Tobacco Use   Smoking status: Never   Smokeless tobacco: Never  Vaping Use   Vaping Use: Former  Substance Use Topics   Alcohol use: Yes    Alcohol/week: 1.0 standard drink    Types: 1 Glasses of wine per week    Comment: social   Drug use: No    Current Outpatient Medications  Medication Sig Dispense Refill   acetaminophen (TYLENOL) 325 MG tablet Take 650 mg by mouth daily.     loratadine (CLARITIN) 10 MG tablet Take by mouth daily as needed.     ferrous sulfate 220 (44 Fe) MG/5ML solution Take 5 mLs (220 mg total) by mouth daily. (Patient not taking: Reported on 08/31/2021) 150 mL 3   No current facility-administered medications for this visit.  Allergies  Allergen Reactions   Codeine Nausea And Vomiting    Other reaction(s): Confusion   Lactose Intolerance (Gi) Other (See Comments)    Constipation, GI upset   Azithromycin Hives    Review of Systems:  Constitutional: Denies fever, chills, diaphoresis, appetite change and fatigue.  HEENT: Denies photophobia, eye pain, redness, hearing loss, ear pain, congestion, sore throat, rhinorrhea, sneezing, mouth sores, neck pain, neck stiffness and tinnitus.   Respiratory: Denies SOB, DOE, cough, chest tightness,  and wheezing.   Cardiovascular: Denies chest pain, palpitations and leg swelling.  Genitourinary: Denies dysuria, urgency, frequency, hematuria, flank pain and difficulty urinating.  Musculoskeletal: Denies myalgias, back pain, joint swelling, arthralgias and gait  problem.  Skin: No rash.  Neurological: Denies dizziness, seizures, syncope, weakness, light-headedness, numbness and headaches.  Hematological: Denies adenopathy. Easy bruising, personal or family bleeding history  Psychiatric/Behavioral: No anxiety or depression     Physical Exam:    BP 120/84   Pulse (!) 110   Ht '5\' 7"'$  (1.702 m)   Wt 174 lb 8 oz (79.2 kg)   SpO2 98%   BMI 27.33 kg/m  Wt Readings from Last 3 Encounters:  08/31/21 174 lb 8 oz (79.2 kg)  10/14/20 179 lb 3.2 oz (81.3 kg)  09/15/20 175 lb (79.4 kg)   Constitutional:  Well-developed, in no acute distress. Psychiatric: Normal mood and affect. Behavior is normal. HEENT: Pupils normal.  Conjunctivae are normal. No scleral icterus. Cardiovascular: Normal rate, regular rhythm. No edema Pulmonary/chest: Effort normal and breath sounds normal. No wheezing, rales or rhonchi. Abdominal: Soft, nondistended.  Mild epi Tenderness.  Bowel sounds active throughout. There are no masses palpable. No hepatomegaly. Rectal: Deferred Neurological: Alert and oriented to person place and time. Skin: Skin is warm and dry. No rashes noted.  Data Reviewed: I have personally reviewed following labs and imaging studies  CBC:    Latest Ref Rng & Units 09/15/2020   10:36 AM 08/20/2020    9:42 AM 06/01/2017   10:09 AM  CBC  WBC 4.0 - 10.5 K/uL 5.1   5.1   5.3    Hemoglobin 12.0 - 15.0 g/dL 11.2   11.1   12.3    Hematocrit 36.0 - 46.0 % 37.3   34.5   38.6    Platelets 150 - 400 K/uL 253   261.0   276      CMP:    Latest Ref Rng & Units 09/15/2020   10:36 AM 08/20/2020    9:42 AM 06/01/2017   10:09 AM  CMP  Glucose 70 - 99 mg/dL 114   87   84    BUN 6 - 20 mg/dL '14   10   11    '$ Creatinine 0.44 - 1.00 mg/dL 0.65   0.62   0.80    Sodium 135 - 145 mmol/L 137   136   141    Potassium 3.5 - 5.1 mmol/L 4.3   4.0   4.8    Chloride 98 - 111 mmol/L 102   102   103    CO2 22 - 32 mmol/L '28   24   25    '$ Calcium 8.9 - 10.3 mg/dL 10.0   10.0    10.1    Total Protein 6.5 - 8.1 g/dL 6.9   7.1   7.3    Total Bilirubin 0.3 - 1.2 mg/dL 0.5   0.7   0.9    Alkaline Phos 38 - 126 U/L 42  50   55    AST 15 - 41 U/L '18   15   14    '$ ALT 0 - 44 U/L '16   9   9        '$ Carmell Austria, MD 08/31/2021, 10:20 AM  Cc: Libby Maw

## 2021-08-31 NOTE — Patient Instructions (Addendum)
If you are age 23 or older, your body mass index should be between 23-30. Your Body mass index is 27.33 kg/m. If this is out of the aforementioned range listed, please consider follow up with your Primary Care Provider.  If you are age 45 or younger, your body mass index should be between 19-25. Your Body mass index is 27.33 kg/m. If this is out of the aformentioned range listed, please consider follow up with your Primary Care Provider.   ________________________________________________________  The [facility] providers would like to encourage you to use [facility] to communicate with providers for non-urgent requests or questions.  Due to long hold times on the telephone, sending your provider a message by [facility] may be a faster and more efficient way to get a response.  Please allow 48 business hours for a response.  Please remember that this is for non-urgent requests.  _______________________________________________________  We have sent the following medications to your pharmacy for you to pick up at your convenience: Protonix '40mg'$  daily for 14 days  You have been scheduled for an endoscopy. Please follow written instructions given to you at your visit today. If you use inhalers (even only as needed), please bring them with you on the day of your procedure.  Thank you,  Dr. Jackquline Denmark

## 2021-09-05 ENCOUNTER — Encounter: Payer: Self-pay | Admitting: Gastroenterology

## 2021-09-05 ENCOUNTER — Ambulatory Visit (AMBULATORY_SURGERY_CENTER): Payer: Self-pay | Admitting: Gastroenterology

## 2021-09-05 VITALS — BP 109/65 | HR 75 | Temp 97.5°F | Resp 15 | Ht 67.0 in | Wt 174.0 lb

## 2021-09-05 DIAGNOSIS — K2950 Unspecified chronic gastritis without bleeding: Secondary | ICD-10-CM | POA: Diagnosis not present

## 2021-09-05 DIAGNOSIS — K2100 Gastro-esophageal reflux disease with esophagitis, without bleeding: Secondary | ICD-10-CM | POA: Diagnosis not present

## 2021-09-05 DIAGNOSIS — K2970 Gastritis, unspecified, without bleeding: Secondary | ICD-10-CM

## 2021-09-05 DIAGNOSIS — R112 Nausea with vomiting, unspecified: Secondary | ICD-10-CM | POA: Diagnosis not present

## 2021-09-05 DIAGNOSIS — R1013 Epigastric pain: Secondary | ICD-10-CM | POA: Diagnosis not present

## 2021-09-05 MED ORDER — SODIUM CHLORIDE 0.9 % IV SOLN: 500.0000 mL | Freq: Once | INTRAVENOUS | Status: DC

## 2021-09-05 MED ORDER — PANTOPRAZOLE SODIUM 20 MG PO TBEC: 20.0000 mg | DELAYED_RELEASE_TABLET | Freq: Every day | ORAL | 3 refills | Status: DC

## 2021-09-05 NOTE — Op Note (Signed)
[facility] Patient Name: Anna Roman Procedure Date: 09/05/2021 10:14 AM MRN: [id number] Endoscopist: Jackquline Denmark , MD Age: 21 Referring MD:  Date of Birth: 2000-07-28 Gender: Female Account #: [id number] Procedure:                Upper GI endoscopy Indications:              Epigastric abdominal pain. H/O N/V. Medicines:                Monitored Anesthesia Care Procedure:                Pre-Anesthesia Assessment:                           - Prior to the procedure, a History and Physical                            was performed, and patient medications and                            allergies were reviewed. The patient's tolerance of                            previous anesthesia was also reviewed. The risks                            and benefits of the procedure and the sedation                            options and risks were discussed with the patient.                            All questions were answered, and informed consent                            was obtained. Prior Anticoagulants: The patient has                            taken no previous anticoagulant or antiplatelet                            agents. ASA Grade Assessment: II - A patient with                            mild systemic disease. After reviewing the risks                            and benefits, the patient was deemed in                            satisfactory condition to undergo the procedure.                           After obtaining informed consent, the endoscope was  passed under direct vision. Throughout the                            procedure, the patient's blood pressure, pulse, and                            oxygen saturations were monitored continuously. The                            Endoscope was introduced through the mouth, and                            advanced to the second part of duodenum. The upper                            GI endoscopy was  accomplished without difficulty.                            The patient tolerated the procedure well. Scope In: Scope Out: Findings:                 LA Grade A (one or more mucosal breaks less than 5                            mm, not extending between tops of 2 mucosal folds)                            esophagitis with no bleeding was found 35 cm from                            the incisors. Biopsies were taken with a cold                            forceps for histology, directed by NBI.                           Localized mild inflammation characterized by                            erythema was found in the gastric body and in the                            gastric antrum. Biopsies were taken with a cold                            forceps for histology (using [place] protocol).                           The examined duodenum was normal. Biopsies for                            histology were taken with a cold forceps for  evaluation of celiac disease. Complications:            No immediate complications. Estimated Blood Loss:     Estimated blood loss: none. Impression:               - LA Grade A reflux esophagitis with no bleeding.                            Biopsied.                           - Mild Gastritis. Biopsied. Recommendation:           - Patient has a contact number available for                            emergencies. The signs and symptoms of potential                            delayed complications were discussed with the                            patient. Return to normal activities tomorrow.                            Written discharge instructions were provided to the                            patient.                           - Resume previous diet.                           - Use Protonix (pantoprazole) 20 mg PO daily #30,                            6RF.                           - Follow an antireflux regimen.                            - Await pathology results.                           - The findings and recommendations were discussed                            with the patient's family. Jackquline Denmark, MD 09/05/2021 10:34:34 AM This report has been signed electronically.

## 2021-09-05 NOTE — Progress Notes (Signed)
Called to room to assist during endoscopic procedure.  Patient ID and intended procedure confirmed with present staff. Received instructions for my participation in the procedure from the performing physician.  

## 2021-09-05 NOTE — Progress Notes (Signed)
Pt non-responsive, VVS, Report to RN  °

## 2021-09-05 NOTE — Progress Notes (Signed)
Chief Complaint: epi pain  Referring Provider:  Libby Maw,*      ASSESSMENT AND PLAN;   #1.  Epi pain with occ N/V. Neg [place] 08/2020. Nl CBC, CMP, amylase  #2.  Chronic constipation.    #3.  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 -D/W mom  HPI:    Anna Roman is a 21 y.o. female  Student at [employer], plays drums Accompanied by her mom H/O lymphocyte predominant Hodgkin's lymphoma Dx 2012, treated with 3 cycles of chemo with AVPC (cured)  C/O Epi pain x 7-8 years. Pain  in the morning with 1st meal, occ vomiting, occ nausea. Better with Tylenol which she takes every day.  She could not definitely identify any food triggers.  No heartburn.  Had negative ultrasound Abdo previously- no splenomegaly 08/2020.  Advised to get EGD.  Also with longstanding constipation.  She was also constipated as a child per her mom, req miralax. BM 1/3 days.  No abdominal discomfort, bloating or abdominal pain.  She does drink plenty of water.  No melena or hematochezia.  Has had heavy menstrual cycles with clots, lasting approximately 1 week. Dx with IDA d/t menorrhagia.  Has appointment with GYN coming up.  No family history of colon cancer or colon polyps.  Past GI work-up [place] abdo 09/20/2020: neg. No splenomegaly. Past Medical History:  Diagnosis Date   Allergy    Anemia    Anxiety    Cancer ([place])    Lymph node enlargement    right side neck    Past Surgical History:  Procedure Laterality Date   lymphoma  04/15/2010   Nodular lymphocyte predominant hodgkin's lymphoma   MASS BIOPSY  02/14/2011   Procedure: NECK MASS BIOPSY;  Surgeon: Rozetta Nunnery, MD;  Location: [facility];  Service: ENT;  Laterality: Right;  excision right  neck node     Family History  Problem Relation Age of Onset   Hypertension Mother    Hypertension Father    Diabetes Maternal Grandmother    Hypertension Maternal Grandmother    Heart disease Maternal Grandfather    Hypertension Paternal Grandmother    Diabetes Paternal Grandmother    Asthma Maternal Uncle    Colon cancer Neg Hx    Rectal cancer Neg Hx    Stomach cancer Neg Hx     Social History   Tobacco Use   Smoking status: Never   Smokeless tobacco: Never  Vaping Use   Vaping Use: Former  Substance Use Topics   Alcohol use: Yes    Alcohol/week: 1.0 standard drink of alcohol    Types: 1 Glasses of wine per week    Comment: social   Drug use: No    Current Outpatient Medications  Medication Sig Dispense Refill   acetaminophen (TYLENOL) 325 MG tablet Take 650 mg by mouth daily.     doxycycline (VIBRA-TABS) 100 MG tablet Take 100 mg by mouth 2 (two) times daily. (Patient not taking: Reported on 09/05/2021)     ferrous sulfate 220 (44 Fe) MG/5ML solution Take 5 mLs (220 mg total) by mouth daily. (Patient not taking: Reported on 08/31/2021) 150 mL 3   loratadine (CLARITIN) 10 MG tablet Take by  mouth daily as needed.     pantoprazole (PROTONIX) 40 MG tablet Take 1 tablet (40 mg total) by mouth daily. For 14 days 14 tablet 0   Current Facility-Administered Medications  Medication Dose Route Frequency Provider Last Rate Last Admin   0.9 %  sodium chloride infusion  500 mL Intravenous Once Jackquline Denmark, MD        Allergies  Allergen Reactions   Codeine Nausea And Vomiting    Other reaction(s): Confusion   Lactose Intolerance (Gi) Other (See Comments)    Constipation, GI upset   Azithromycin Hives    Review of Systems:  Constitutional: Denies fever, chills, diaphoresis, appetite change and fatigue.  HEENT: Denies photophobia, eye pain, redness, hearing loss, ear pain, congestion, sore throat, rhinorrhea, sneezing, mouth sores, neck pain, neck stiffness and tinnitus.    Respiratory: Denies SOB, DOE, cough, chest tightness,  and wheezing.   Cardiovascular: Denies chest pain, palpitations and leg swelling.  Genitourinary: Denies dysuria, urgency, frequency, hematuria, flank pain and difficulty urinating.  Musculoskeletal: Denies myalgias, back pain, joint swelling, arthralgias and gait problem.  Skin: No rash.  Neurological: Denies dizziness, seizures, syncope, weakness, light-headedness, numbness and headaches.  Hematological: Denies adenopathy. Easy bruising, personal or family bleeding history  Psychiatric/Behavioral: No anxiety or depression     Physical Exam:    BP 120/69   Pulse 82   Temp (!) 97.5 F (36.4 C)   Ht '5\' 7"'$  (1.702 m)   Wt 174 lb (78.9 kg)   SpO2 100%   BMI 27.25 kg/m  Wt Readings from Last 3 Encounters:  09/05/21 174 lb (78.9 kg)  08/31/21 174 lb 8 oz (79.2 kg)  10/14/20 179 lb 3.2 oz (81.3 kg)   Constitutional:  Well-developed, in no acute distress. Psychiatric: Normal mood and affect. Behavior is normal. HEENT: Pupils normal.  Conjunctivae are normal. No scleral icterus. Cardiovascular: Normal rate, regular rhythm. No edema Pulmonary/chest: Effort normal and breath sounds normal. No wheezing, rales or rhonchi. Abdominal: Soft, nondistended.  Mild epi Tenderness.  Bowel sounds active throughout. There are no masses palpable. No hepatomegaly. Rectal: Deferred Neurological: Alert and oriented to person place and time. Skin: Skin is warm and dry. No rashes noted.  Data Reviewed: I have personally reviewed following labs and imaging studies  CBC:    Latest Ref Rng & Units 09/15/2020   10:36 AM 08/20/2020    9:42 AM 06/01/2017   10:09 AM  CBC  WBC 4.0 - 10.5 K/uL 5.1  5.1  5.3   Hemoglobin 12.0 - 15.0 g/dL 11.2  11.1  12.3   Hematocrit 36.0 - 46.0 % 37.3  34.5  38.6   Platelets 150 - 400 K/uL 253  261.0  276     CMP:    Latest Ref Rng & Units 09/15/2020   10:36 AM 08/20/2020    9:42 AM 06/01/2017   10:09 AM  CMP   Glucose 70 - 99 mg/dL 114  87  84   BUN 6 - 20 mg/dL '14  10  11   '$ Creatinine 0.44 - 1.00 mg/dL 0.65  0.62  0.80   Sodium 135 - 145 mmol/L 137  136  141   Potassium 3.5 - 5.1 mmol/L 4.3  4.0  4.8   Chloride 98 - 111 mmol/L 102  102  103   CO2 22 - 32 mmol/L '28  24  25   '$ Calcium 8.9 - 10.3 mg/dL 10.0  10.0  10.1   Total Protein 6.5 - 8.1  g/dL 6.9  7.1  7.3   Total Bilirubin 0.3 - 1.2 mg/dL 0.5  0.7  0.9   Alkaline Phos 38 - 126 U/L 42  50  55   AST 15 - 41 U/L '18  15  14   '$ ALT 0 - 44 U/L '16  9  9       '$ Carmell Austria, MD 09/05/2021, 10:09 AM  Cc: Libby Maw

## 2021-09-05 NOTE — Patient Instructions (Signed)
Thank you for letting us take care of your healthcare needs today. Please see handouts given to you on Gastritis and Esophagitis.   YOU HAD AN ENDOSCOPIC PROCEDURE TODAY AT [facility]:   Refer to the procedure report that was given to you for any specific questions about what was found during the examination.  If the procedure report does not answer your questions, please call your gastroenterologist to clarify.  If you requested that your care partner not be given the details of your procedure findings, then the procedure report has been included in a sealed envelope for you to review at your convenience later.  YOU SHOULD EXPECT: Some feelings of bloating in the abdomen. Passage of more gas than usual.  Walking can help get rid of the air that was put into your GI tract during the procedure and reduce the bloating. If you had a lower endoscopy (such as a colonoscopy or flexible sigmoidoscopy) you may notice spotting of blood in your stool or on the toilet paper. If you underwent a bowel prep for your procedure, you may not have a normal bowel movement for a few days.  Please Note:  You might notice some irritation and congestion in your nose or some drainage.  This is from the oxygen used during your procedure.  There is no need for concern and it should clear up in a day or so.  SYMPTOMS TO REPORT IMMEDIATELY:  Following upper endoscopy (EGD)  Vomiting of blood or coffee ground material  New chest pain or pain under the shoulder blades  Painful or persistently difficult swallowing  New shortness of breath  Fever of 100F or higher  Black, tarry-looking stools  For urgent or emergent issues, a gastroenterologist can be reached at any hour by calling [phone]. Do not use MyChart messaging for urgent concerns.    DIET:  We do recommend a small meal at first, but then you may proceed to your regular diet.  Drink plenty of fluids but you should avoid alcoholic beverages  for 24 hours.  ACTIVITY:  You should plan to take it easy for the rest of today and you should NOT DRIVE or use heavy machinery until tomorrow (because of the sedation medicines used during the test).    FOLLOW UP: Our staff will call the number listed on your records 24-72 hours following your procedure to check on you and address any questions or concerns that you may have regarding the information given to you following your procedure. If we do not reach you, we will leave a message.  We will attempt to reach you two times.  During this call, we will ask if you have developed any symptoms of COVID 19. If you develop any symptoms (ie: fever, flu-like symptoms, shortness of breath, cough etc.) before then, please call [phone].  If you test positive for Covid 19 in the 2 weeks post procedure, please call and report this information to [place].    If any biopsies were taken you will be contacted by phone or by letter within the next 1-3 weeks.  Please call us at [phone] if you have not heard about the biopsies in 3 weeks.    SIGNATURES/CONFIDENTIALITY: You and/or your care partner have signed paperwork which will be entered into your electronic medical record.  These signatures attest to the fact that that the information above on your After Visit Summary has been reviewed and is understood.  Full responsibility of the confidentiality of  this discharge information lies with you and/or your care-partner.

## 2021-09-06 ENCOUNTER — Telehealth: Payer: Self-pay

## 2021-09-06 NOTE — Telephone Encounter (Signed)
Left message on follow up call. 

## 2021-09-06 NOTE — Telephone Encounter (Signed)
  Follow up Call-     09/05/2021    9:29 AM  Call back number  Post procedure Call Back phone  # [phone]  Permission to leave phone message Yes     Patient questions:  Do you have a fever, pain , or abdominal swelling? No. Pain Score  0 *  Have you tolerated food without any problems? Yes.    Have you been able to return to your normal activities? Yes.    Do you have any questions about your discharge instructions: Diet   No. Medications  No. Follow up visit  No.  Do you have questions or concerns about your Care? No.  Actions: * If pain score is 4 or above: No action needed, pain <4.

## 2021-09-10 ENCOUNTER — Encounter: Payer: Self-pay | Admitting: Gastroenterology

## 2021-09-12 DIAGNOSIS — N9089 Other specified noninflammatory disorders of vulva and perineum: Secondary | ICD-10-CM | POA: Diagnosis not present

## 2021-11-23 DIAGNOSIS — Z01419 Encounter for gynecological examination (general) (routine) without abnormal findings: Secondary | ICD-10-CM | POA: Diagnosis not present

## 2022-04-11 ENCOUNTER — Other Ambulatory Visit: Payer: No Typology Code available for payment source

## 2022-04-11 ENCOUNTER — Ambulatory Visit (INDEPENDENT_AMBULATORY_CARE_PROVIDER_SITE_OTHER): Payer: No Typology Code available for payment source | Admitting: Nurse Practitioner

## 2022-04-11 VITALS — Wt 171.0 lb

## 2022-04-11 DIAGNOSIS — R112 Nausea with vomiting, unspecified: Secondary | ICD-10-CM

## 2022-04-11 DIAGNOSIS — K219 Gastro-esophageal reflux disease without esophagitis: Secondary | ICD-10-CM | POA: Diagnosis not present

## 2022-04-11 DIAGNOSIS — Z8572 Personal history of non-Hodgkin lymphomas: Secondary | ICD-10-CM

## 2022-04-11 DIAGNOSIS — R1084 Generalized abdominal pain: Secondary | ICD-10-CM

## 2022-04-11 LAB — CBC WITH DIFFERENTIAL/PLATELET
Basophils Absolute: 0 10*3/uL (ref 0.0–0.1)
Basophils Relative: 0.5 % (ref 0.0–3.0)
Eosinophils Absolute: 0.1 10*3/uL (ref 0.0–0.7)
Eosinophils Relative: 1.4 % (ref 0.0–5.0)
HCT: 38.6 % (ref 36.0–46.0)
Hemoglobin: 12.5 g/dL (ref 12.0–15.0)
Lymphocytes Relative: 23 % (ref 12.0–46.0)
Lymphs Abs: 1.5 10*3/uL (ref 0.7–4.0)
MCHC: 32.4 g/dL (ref 30.0–36.0)
MCV: 77.7 fl — ABNORMAL LOW (ref 78.0–100.0)
Monocytes Absolute: 0.5 10*3/uL (ref 0.1–1.0)
Monocytes Relative: 7.6 % (ref 3.0–12.0)
Neutro Abs: 4.5 10*3/uL (ref 1.4–7.7)
Neutrophils Relative %: 67.5 % (ref 43.0–77.0)
Platelets: 310 10*3/uL (ref 150.0–400.0)
RBC: 4.97 Mil/uL (ref 3.87–5.11)
RDW: 15.9 % — ABNORMAL HIGH (ref 11.5–15.5)
WBC: 6.7 10*3/uL (ref 4.0–10.5)

## 2022-04-11 LAB — COMPREHENSIVE METABOLIC PANEL
ALT: 7 U/L (ref 0–35)
AST: 14 U/L (ref 0–37)
Albumin: 4.8 g/dL (ref 3.5–5.2)
Alkaline Phosphatase: 50 U/L (ref 39–117)
BUN: 10 mg/dL (ref 6–23)
CO2: 28 mEq/L (ref 19–32)
Calcium: 9.7 mg/dL (ref 8.4–10.5)
Chloride: 102 mEq/L (ref 96–112)
Creatinine, Ser: 0.72 mg/dL (ref 0.40–1.20)
GFR: 117.66 mL/min (ref >60.00)
Glucose, Bld: 80 mg/dL (ref 70–99)
Potassium: 4.1 mEq/L (ref 3.5–5.1)
Sodium: 138 mEq/L (ref 135–145)
Total Bilirubin: 0.6 mg/dL (ref 0.2–1.2)
Total Protein: 7.6 g/dL (ref 6.0–8.3)

## 2022-04-11 MED ORDER — DICYCLOMINE HCL 10 MG PO CAPS: 10.0000 mg | ORAL_CAPSULE | Freq: Every day | ORAL | 1 refills | Status: DC | PRN

## 2022-04-11 MED ORDER — OMEPRAZOLE 40 MG PO CPDR: 40.0000 mg | DELAYED_RELEASE_CAPSULE | Freq: Every day | ORAL | 1 refills | Status: DC

## 2022-04-11 NOTE — Progress Notes (Signed)
04/11/2022 Anna Roman [id number] November 03, 2000   Chief Complaint: Stomach pain   History of Present Illness: Anna Roman is a 22 year old female with a history of anxiety, anemia, Hodgkin's lymphoma diagnosed at the age of 28 in remission, chronic epigastric pain and GERD.  She is known by Dr. Lyndel Safe.  She has a history of stomach pain which occurs in the morning shortly after eating breakfast, no specific food triggers.  If she takes Tylenol in the morning then eats breakfast she does not exhibit any stomach pain.  Her mother is concerned regarding Anna Roman's chronic Tylenol use.  She does not take Tylenol in the morning, she developed significant central upper abdominal pain which is relieved by self-induced vomiting. She underwent an abdominal sonogram 09/20/2020 which showed a normal gallbladder and liver. She underwent an EGD 09/05/2021 which showed grade A reflux esophagitis and cystitis, no evidence of Barrett's esophagus or H. pylori.  She was prescribed Pantoprazole 20 mg p.o. daily which she took for 4 weeks and stopped because she did not think it was helping her symptoms.  She denies having any dysphagia or heartburn.  No NSAID use.  She drinks 2 large Coca-Cola's most days.     Latest Ref Rng & Units 09/15/2020   10:36 AM 08/20/2020    9:42 AM 06/01/2017   10:09 AM  CBC  WBC 4.0 - 10.5 K/uL 5.1  5.1  5.3   Hemoglobin 12.0 - 15.0 g/dL 11.2  11.1  12.3   Hematocrit 36.0 - 46.0 % 37.3  34.5  38.6   Platelets 150 - 400 K/uL 253  261.0  276        Latest Ref Rng & Units 09/15/2020   10:36 AM 08/20/2020    9:42 AM 06/01/2017   10:09 AM  CMP  Glucose 70 - 99 mg/dL 114  87  84   BUN 6 - 20 mg/dL '14  10  11   '$ Creatinine 0.44 - 1.00 mg/dL 0.65  0.62  0.80   Sodium 135 - 145 mmol/L 137  136  141   Potassium 3.5 - 5.1 mmol/L 4.3  4.0  4.8   Chloride 98 - 111 mmol/L 102  102  103   CO2 22 - 32 mmol/L '28  24  25   '$ Calcium 8.9 - 10.3 mg/dL 10.0  10.0  10.1   Total Protein 6.5 - 8.1  g/dL 6.9  7.1  7.3   Total Bilirubin 0.3 - 1.2 mg/dL 0.5  0.7  0.9   Alkaline Phos 38 - 126 U/L 42  50  55   AST 15 - 41 U/L '18  15  14   '$ ALT 0 - 44 U/L '16  9  9     '$ Abdominal sonogram 09/20/2020:  FINDINGS: Gallbladder: No gallstones or wall thickening visualized. No sonographic Murphy sign noted by sonographer.   Common bile duct: Diameter: 3 mm   Liver: No focal lesion identified. Within normal limits in parenchymal echogenicity. Portal vein is patent on color Doppler imaging with normal direction of blood flow towards the liver.   IVC: No abnormality visualized.   Pancreas: Visualized portion unremarkable.   Spleen: Size and appearance within normal limits.   Right Kidney: Length: 10.8 cm. Echogenicity within normal limits. No mass or hydronephrosis visualized.   Left Kidney: Length: 10 cm. Echogenicity within normal limits. No mass or hydronephrosis visualized.   Abdominal aorta: No aneurysm visualized.   Other findings: None.   IMPRESSION:  Unremarkable  GI PROCEDURES:  EGD 09/05/2021: LA grade a reflux esophagitis with no bleeding.  Biopsied. Mild gastritis.  Biopsied. 1. Surgical [P], small bowel BENIGN DUODENAL MUCOSA WITH NO DIAGNOSTIC ABNORMALITY 2. Surgical [P], gastric MILD CHRONIC GASTRITIS WITH LYMPHOID AGGREGATES NEGATIVE FOR H. PYLORI, INTESTINAL METAPLASIA, DYSPLASIA AND CARCINOMA 3. Surgical [P], distal esophagus REFLUX ESOPHAGITIS (13 EOS/HIGH POWER FIELD IN FIELD OF GREATEST DENSITY) CHRONIC GASTRITIS NEGATIVE FOR INTESTINAL METAPLASIA, DYSPLASIA AND CARCINOMA  Current Medications, Allergies, Past Medical History, Past Surgical History, Family History and Social History were reviewed in [employer] record.  Review of Systems:   Constitutional: Negative for fever, sweats, chills or weight loss.  Respiratory: Negative for shortness of breath.   Cardiovascular: Negative for chest pain, palpitations and leg swelling.   Gastrointestinal: See HPI.  Musculoskeletal: Negative for back pain or muscle aches.  Neurological: Negative for dizziness, headaches or paresthesias.   Physical Exam: Wt 171 lb (77.6 kg)   BMI 26.78 kg/m   General: 22 year old female in no acute distress. Head: Normocephalic and atraumatic. Eyes: No scleral icterus. Conjunctiva pink . Ears: Normal auditory acuity. Mouth: Dentition intact. No ulcers or lesions.  Lungs: Clear throughout to auscultation. Heart: Regular rate and rhythm, no murmur. Abdomen: Soft, nontender and nondistended. No masses or hepatomegaly. Normal bowel sounds x 4 quadrants.  Rectal: Deferred Musculoskeletal: Symmetrical with no gross deformities. Extremities: No edema. Neurological: Alert oriented x 4. No focal deficits.  Psychological: Alert and cooperative. Normal mood and affect  Assessment and Recommendations:  5) 22 year old female with chronic intermittent epigastric pain which occurs in the morning after eating breakfast, no specific food triggers.  She does not exhibit any epigastric pain if she takes Tylenol before she eats breakfast.  She intermittently self induces vomiting for symptom relief.  Normal abdominal sonogram 09/20/2020.  EGD 08/2021 showed reflux esophagitis and gastritis.  She took Pantoprazole 20 mg p.o. daily for 4 weeks then stopped it. -CBC, CMP -CTAP with contrast to rule out any intra-abdominal/pelvic pathology to epigastric pain  -Dicyclomine 10 mg 1 p.o. to be taken prior to breakfast -Omeprazole '40mg'$  one po QD  -Continue to take Tylenol 3 25 mg 1 tab p.o. every morning for now -Further recommendations to be determined after CT and lab results reviewed  2) History of non-Hodgkin's lymphoma  Today's encounter was 25 minutes which included precharting, chart/result review, history/exam, face-to-face time used for counseling, formulating a treatment plan with follow-up and documentation.

## 2022-04-11 NOTE — Patient Instructions (Addendum)
Your provider has requested that you go to the basement level for lab work before leaving today. Press "B" on the elevator. The lab is located at the first door on the left as you exit the elevator.  We have sent the following medications to your pharmacy for you to pick up at your convenience: Omeprazole 40 mg & Dicyclomine 10 mg  You have been scheduled for a CT scan of the abdomen and pelvis at [facility], 1st floor Radiology. You are scheduled on __________ at ___________. You should arrive 15 minutes prior to your appointment time for registration.  We are giving you 2 bottles of contrast today that you will need to drink before arriving for the exam. The solution may taste better if refrigerated so put them in the refrigerator when you get home, but do NOT add ice or any other liquid to this solution as that would dilute it. Shake well before drinking.   You may take any medications as prescribed with a small amount of water, if necessary. If you take any of the following medications: METFORMIN, GLUCOPHAGE, GLUCOVANCE, AVANDAMET, RIOMET, FORTAMET, [place] MET, JANUMET, GLUMETZA or METAGLIP, you MAY be asked to HOLD this medication 48 hours AFTER the exam.   The purpose of you drinking the oral contrast is to aid in the visualization of your intestinal tract. The contrast solution may cause some diarrhea. Depending on your individual set of symptoms, you may also receive an intravenous injection of x-ray contrast/dye. Plan on being at [facility] for 45 minutes or longer, depending on the type of exam you are having performed.   If you have any questions regarding your exam or if you need to reschedule, you may call [facility] at [phone] between the hours of 8:00 am and 5:00 pm, Monday-Friday.     Due to recent changes in healthcare laws, you may see the results of your imaging and laboratory studies on MyChart before your provider has had a chance to review them.  We  understand that in some cases there may be results that are confusing or concerning to you. Not all laboratory results come back in the same time frame and the provider may be waiting for multiple results in order to interpret others.  Please give [place] 48 hours in order for your provider to thoroughly review all the results before contacting the office for clarification of your results.    Thank you for trusting me with your gastrointestinal care!   Carl Best, CRNP

## 2022-04-16 NOTE — Progress Notes (Signed)
Agree with assessment/plan.  Raj Jordana Dugue, MD [facility] [phone]  

## 2022-04-19 ENCOUNTER — Ambulatory Visit (INDEPENDENT_AMBULATORY_CARE_PROVIDER_SITE_OTHER): Payer: No Typology Code available for payment source | Admitting: Family Medicine

## 2022-04-19 ENCOUNTER — Encounter: Payer: Self-pay | Admitting: Family Medicine

## 2022-04-19 VITALS — BP 134/78 | HR 93 | Temp 97.9°F | Ht 67.0 in | Wt 172.6 lb

## 2022-04-19 DIAGNOSIS — R718 Other abnormality of red blood cells: Secondary | ICD-10-CM

## 2022-04-19 DIAGNOSIS — Z0000 Encounter for general adult medical examination without abnormal findings: Secondary | ICD-10-CM | POA: Diagnosis not present

## 2022-04-19 DIAGNOSIS — Z23 Encounter for immunization: Secondary | ICD-10-CM

## 2022-04-19 DIAGNOSIS — D509 Iron deficiency anemia, unspecified: Secondary | ICD-10-CM | POA: Diagnosis not present

## 2022-04-19 DIAGNOSIS — Z13220 Encounter for screening for lipoid disorders: Secondary | ICD-10-CM | POA: Diagnosis not present

## 2022-04-19 LAB — LIPID PANEL
Cholesterol: 155 mg/dL (ref 0–200)
HDL: 65.6 mg/dL (ref >39.00)
LDL Cholesterol: 78 mg/dL (ref 0–99)
NonHDL: 89.29
Total CHOL/HDL Ratio: 2
Triglycerides: 58 mg/dL (ref 0.0–149.0)
VLDL: 11.6 mg/dL (ref 0.0–40.0)

## 2022-04-19 LAB — B12 AND FOLATE PANEL
Folate: 17.5 ng/mL (ref >5.9)
Vitamin B-12: 627 pg/mL (ref 211–911)

## 2022-04-19 LAB — URINALYSIS, ROUTINE W REFLEX MICROSCOPIC
Bilirubin Urine: NEGATIVE
Hgb urine dipstick: NEGATIVE
Leukocytes,Ua: NEGATIVE
Nitrite: NEGATIVE
Specific Gravity, Urine: 1.02 (ref 1.000–1.030)
Total Protein, Urine: NEGATIVE
Urine Glucose: NEGATIVE
Urobilinogen, UA: 0.2 (ref 0.0–1.0)
pH: 6 (ref 5.0–8.0)

## 2022-04-19 NOTE — Progress Notes (Addendum)
Established Patient Office Visit   Subjective:  Patient ID: BETHANIE BLOXOM, female    DOB: 2001/01/12  Age: 22 y.o. MRN: [id number]  Chief Complaint  Patient presents with   Annual Exam    CPE, would like iron levels checked. Patient fasting    HPI Encounter Diagnoses  Name Primary?   Iron deficiency anemia, unspecified iron deficiency anemia type Yes   Microcytosis    Healthcare maintenance    Need for immunization against influenza    Screening for hypercholesterolemia    For health check and follow up of iron deficiency.  Continues follow-up with GYN and gastroenterology.  Recent labs showed low normal hemoglobin levels with microcytosis.  She is quite busy.  She is working and going to school.  She is seeking a degree in liberal arts.  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  She lives at home with her mother.   Review of Systems  Constitutional: Negative.   HENT: Negative.    Eyes:  Negative for blurred vision, discharge and redness.  Respiratory: Negative.    Cardiovascular: Negative.   Gastrointestinal:  Negative for abdominal pain.  Genitourinary: Negative.   Musculoskeletal: Negative.  Negative for myalgias.  Skin:  Negative for rash.  Neurological:  Negative for tingling, loss of consciousness and weakness.  Endo/Heme/Allergies:  Negative for polydipsia.     Current Outpatient Medications:    ferrous gluconate (FERGON) 240 (27 FE) MG tablet, Take 1 tablet (240 mg total) by mouth daily., Disp: 90 tablet, Rfl: 1   ferrous sulfate 220 (44 Fe) MG/5ML solution, Take 5 mLs (220 mg total) by mouth daily. (Patient not taking: Reported on 08/31/2021), Disp: 150 mL, Rfl: 3   Objective:     BP 134/78 (BP Location: Right Arm, Patient Position: Sitting, Cuff Size: Large)   Pulse 93   Temp 97.9 F (36.6 C) (Temporal)   Ht '5\' 7"'$  (1.702 m)   Wt 172 lb 9.6 oz (78.3 kg)   LMP 04/06/2022   SpO2 97%   BMI 27.03 kg/m     Physical Exam Constitutional:      General: She is not in acute distress.    Appearance: Normal appearance. She is not ill-appearing, toxic-appearing or diaphoretic.  HENT:     Head: Normocephalic and atraumatic.     Right Ear: Tympanic membrane, ear canal and external ear normal.     Left Ear: Tympanic membrane, ear canal and external ear normal.     Mouth/Throat:     Mouth: Mucous membranes are moist.     Pharynx: Oropharynx is clear. No oropharyngeal exudate or posterior oropharyngeal erythema.  Eyes:     General: No scleral icterus.       Right eye: No discharge.        Left eye: No discharge.     Extraocular Movements: Extraocular movements intact.     Conjunctiva/sclera: Conjunctivae normal.     Pupils: Pupils are equal, round, and reactive to light.  Cardiovascular:     Rate and Rhythm: Normal rate and regular rhythm.  Pulmonary:     Effort: Pulmonary effort is normal. No respiratory distress.     Breath sounds: Normal breath sounds.  Abdominal:     General: Bowel sounds are normal.  Musculoskeletal:     Cervical back: No rigidity or tenderness.  Skin:    General: Skin is warm and dry.  Neurological:     Mental Status: She is alert and oriented  to person, place, and time.  Psychiatric:        Mood and Affect: Mood normal.        Behavior: Behavior normal.      Results for orders placed or performed in visit on 04/19/22  Lipid panel  Result Value Ref Range   Cholesterol 155 0 - 200 mg/dL   Triglycerides 58.0 0.0 - 149.0 mg/dL   HDL 65.60 >39.00 mg/dL   VLDL 11.6 0.0 - 40.0 mg/dL   LDL Cholesterol 78 0 - 99 mg/dL   Total CHOL/HDL Ratio 2    NonHDL 89.29   Iron, TIBC and Ferritin Panel  Result Value Ref Range   Iron 24 (L) 40 - 190 mcg/dL   TIBC 399 250 - 450 mcg/dL (calc)   %SAT 6 (L) 16 - 45 % (calc)   Ferritin 4 (L) 16 - 154 ng/mL  B12 and Folate Panel  Result Value Ref Range   Vitamin B-12 627 211 - 911 pg/mL   Folate 17.5 >5.9 ng/mL   Urinalysis, Routine w reflex microscopic  Result Value Ref Range   Color, Urine YELLOW Yellow;Lt. Yellow;Straw;Dark Yellow;Amber;Green;Red;Brown   APPearance CLEAR Clear;Turbid;Slightly Cloudy;Cloudy   Specific Gravity, Urine 1.020 1.000 - 1.030   pH 6.0 5.0 - 8.0   Total Protein, Urine NEGATIVE Negative   Urine Glucose NEGATIVE Negative   Ketones, ur TRACE (A) Negative   Bilirubin Urine NEGATIVE Negative   Hgb urine dipstick NEGATIVE Negative   Urobilinogen, UA 0.2 0.0 - 1.0   Leukocytes,Ua NEGATIVE Negative   Nitrite NEGATIVE Negative   WBC, UA 0-2/hpf 0-2/hpf   RBC / HPF 0-2/hpf 0-2/hpf   Mucus, UA Presence of (A) None   Squamous Epithelial / HPF Rare(0-4/hpf) Rare(0-4/hpf)   Bacteria, UA Rare(<10/hpf) (A) None      The ASCVD Risk score (Arnett DK, et al., 2019) failed to calculate for the following reasons:   The 2019 ASCVD risk score is only valid for ages 32 to 18    Assessment & Plan:   Iron deficiency anemia, unspecified iron deficiency anemia type -     Iron, TIBC and Ferritin Panel -     Ferrous Gluconate; Take 1 tablet (240 mg total) by mouth daily.  Dispense: 90 tablet; Refill: 1  Microcytosis -     B12 and Folate Panel  Healthcare maintenance -     Lipid panel -     Urinalysis, Routine w reflex microscopic  Need for immunization against influenza -     Flu Vaccine QUAD 27moIM (Fluarix, Fluzone & Alfiuria Quad PF)  Screening for hypercholesterolemia    Return in about 1 year (around 04/20/2023).  Suggested that she try an iron gluconate product for chronic iron deficiency.  Continue follow-up with GI for ongoing gastritis.  Follow-up with GYN for female care.  Information was given on health maintenance and disease prevention.  Encouraged her to continue her healthy active lifestyle.  WLibby Maw MD

## 2022-04-20 ENCOUNTER — Ambulatory Visit (HOSPITAL_COMMUNITY)
Admission: RE | Admit: 2022-04-20 | Discharge: 2022-04-20 | Disposition: A | Discharge status: Home / Self Care | Payer: 59 | Source: Ambulatory Visit | Attending: Nurse Practitioner | Admitting: Nurse Practitioner

## 2022-04-20 DIAGNOSIS — R1084 Generalized abdominal pain: Secondary | ICD-10-CM | POA: Insufficient documentation

## 2022-04-20 DIAGNOSIS — R112 Nausea with vomiting, unspecified: Secondary | ICD-10-CM | POA: Insufficient documentation

## 2022-04-20 DIAGNOSIS — Z8572 Personal history of non-Hodgkin lymphomas: Secondary | ICD-10-CM | POA: Insufficient documentation

## 2022-04-20 DIAGNOSIS — R109 Unspecified abdominal pain: Secondary | ICD-10-CM | POA: Diagnosis not present

## 2022-04-20 DIAGNOSIS — K219 Gastro-esophageal reflux disease without esophagitis: Secondary | ICD-10-CM | POA: Insufficient documentation

## 2022-04-20 LAB — IRON,TIBC AND FERRITIN PANEL
%SAT: 6 % (calc) — ABNORMAL LOW (ref 16–45)
Ferritin: 4 ng/mL — ABNORMAL LOW (ref 16–154)
Iron: 24 ug/dL — ABNORMAL LOW (ref 40–190)
TIBC: 399 mcg/dL (calc) (ref 250–450)

## 2022-04-20 MED ORDER — IOHEXOL 300 MG/ML  SOLN
100.0000 mL | Freq: Once | INTRAMUSCULAR | Status: AC | PRN
  Administered 2022-04-20: 100 mL via INTRAVENOUS

## 2022-04-20 MED ORDER — FERROUS GLUCONATE 240 (27 FE) MG PO TABS: 240.0000 mg | ORAL_TABLET | Freq: Every day | ORAL | 1 refills | Status: AC

## 2022-04-20 NOTE — Addendum Note (Signed)
Addended by: Jon Billings on: 04/20/2022 12:35 PM   Modules accepted: Orders

## 2022-04-24 ENCOUNTER — Telehealth: Payer: Self-pay | Admitting: Nurse Practitioner

## 2022-04-24 NOTE — Telephone Encounter (Signed)
Spoke with pt. Documented under result notes:

## 2022-04-24 NOTE — Telephone Encounter (Signed)
Patient is returning your call to go over CT results.

## 2022-05-11 ENCOUNTER — Ambulatory Visit: Payer: 59 | Admitting: Gastroenterology

## 2022-12-14 ENCOUNTER — Ambulatory Visit (INDEPENDENT_AMBULATORY_CARE_PROVIDER_SITE_OTHER): Payer: No Typology Code available for payment source | Admitting: Family Medicine

## 2022-12-14 ENCOUNTER — Encounter: Payer: Self-pay | Admitting: Family Medicine

## 2022-12-14 VITALS — BP 118/80 | HR 90 | Temp 97.9°F | Ht 67.0 in | Wt 182.2 lb

## 2022-12-14 DIAGNOSIS — D509 Iron deficiency anemia, unspecified: Secondary | ICD-10-CM | POA: Diagnosis not present

## 2022-12-14 DIAGNOSIS — Z23 Encounter for immunization: Secondary | ICD-10-CM

## 2022-12-14 DIAGNOSIS — R718 Other abnormality of red blood cells: Secondary | ICD-10-CM

## 2022-12-14 DIAGNOSIS — M545 Low back pain, unspecified: Secondary | ICD-10-CM

## 2022-12-14 DIAGNOSIS — S39012A Strain of muscle, fascia and tendon of lower back, initial encounter: Secondary | ICD-10-CM

## 2022-12-14 MED ORDER — MELOXICAM 7.5 MG PO TABS: 7.5000 mg | ORAL_TABLET | Freq: Every day | ORAL | 0 refills | Status: DC

## 2022-12-14 MED ORDER — METHOCARBAMOL 500 MG PO TABS: ORAL_TABLET | ORAL | 0 refills | Status: AC

## 2022-12-14 NOTE — Progress Notes (Signed)
Established Patient Office Visit   Subjective:  Patient ID: Anna Roman, female    DOB: 2000/07/12  Age: 22 y.o. MRN: [id number]  Chief Complaint  Patient presents with   Back Pain    Right lower back pain and right hip pain x 1 year. No injury.     Back Pain Pertinent negatives include no abdominal pain, tingling or weakness.   Encounter Diagnoses  Name Primary?   Right-sided low back pain without sciatica, unspecified chronicity Yes   Strain of lumbar region, initial encounter    Iron deficiency anemia, unspecified iron deficiency anemia type    Microcytosis    1 year history of right lower back pain.  There was no injury.  She is a Surveyor, minerals.  Denies some weakness, numbness or tingling in her lower extremities.  No bowel or bladder incontinence.  She has been taking her iron tablets intermittently.   Review of Systems  Constitutional: Negative.   HENT: Negative.    Eyes:  Negative for blurred vision, discharge and redness.  Respiratory: Negative.    Cardiovascular: Negative.   Gastrointestinal:  Negative for abdominal pain.  Genitourinary: Negative.   Musculoskeletal:  Positive for back pain. Negative for myalgias.  Skin:  Negative for rash.  Neurological:  Negative for tingling, focal weakness, loss of consciousness and weakness.  Endo/Heme/Allergies:  Negative for polydipsia.     Current Outpatient Medications:    ferrous gluconate (FERGON) 240 (27 FE) MG tablet, Take 1 tablet (240 mg total) by mouth daily., Disp: 90 tablet, Rfl: 1   meloxicam (MOBIC) 7.5 MG tablet, Take 1 tablet (7.5 mg total) by mouth daily., Disp: 30 tablet, Rfl: 0   methocarbamol (ROBAXIN) 500 MG tablet, Take 1 daily for 2 weeks and thereafter daily as needed., Disp: 30 tablet, Rfl: 0   ferrous sulfate 220 (44 Fe) MG/5ML solution, Take 5 mLs (220 mg total) by mouth daily. (Patient not taking: Reported on 08/31/2021), Disp: 150 mL, Rfl: 3   Objective:     BP 118/80   Pulse 90   Temp 97.9  F (36.6 C)   Ht 5\' 7"  (1.702 m)   Wt 182 lb 3.2 oz (82.6 kg)   SpO2 95%   BMI 28.54 kg/m    Physical Exam Constitutional:      General: She is not in acute distress.    Appearance: Normal appearance. She is not ill-appearing, toxic-appearing or diaphoretic.  HENT:     Head: Normocephalic and atraumatic.     Right Ear: External ear normal.     Left Ear: External ear normal.  Eyes:     General: No scleral icterus.       Right eye: No discharge.        Left eye: No discharge.     Extraocular Movements: Extraocular movements intact.     Conjunctiva/sclera: Conjunctivae normal.  Pulmonary:     Effort: Pulmonary effort is normal. No respiratory distress.  Musculoskeletal:     Lumbar back: No swelling, spasms, tenderness or bony tenderness. Decreased range of motion. Negative right straight leg raise test and negative left straight leg raise test.  Skin:    General: Skin is warm and dry.  Neurological:     Mental Status: She is alert and oriented to person, place, and time.     Motor: No weakness.     Deep Tendon Reflexes:     Reflex Scores:      Patellar reflexes are 1+ on the right side  and 1+ on the left side.      Achilles reflexes are 1+ on the right side and 1+ on the left side. Psychiatric:        Mood and Affect: Mood normal.        Behavior: Behavior normal.      No results found for any visits on 12/14/22.    The ASCVD Risk score (Arnett DK, et al., 2019) failed to calculate for the following reasons:   The 2019 ASCVD risk score is only valid for ages 22 to 20    Assessment & Plan:   Right-sided low back pain without sciatica, unspecified chronicity  Strain of lumbar region, initial encounter -     Ambulatory referral to Physical Therapy -     Meloxicam; Take 1 tablet (7.5 mg total) by mouth daily.  Dispense: 30 tablet; Refill: 0 -     Methocarbamol; Take 1 daily for 2 weeks and thereafter daily as needed.  Dispense: 30 tablet; Refill: 0  Iron  deficiency anemia, unspecified iron deficiency anemia type -     CBC -     Iron, TIBC and Ferritin Panel  Microcytosis -     Alpha-Thalassemia GenotypR    Return in about 8 weeks (around 02/08/2023).  Will check plain films due to length of symptoms.  Physical therapy.  Meloxicam daily for 2 weeks and then as needed.  Recheck in iron deficiency.  Encouraged her to take the iron tablets at least every other day.  Mliss Sax, MD

## 2022-12-15 ENCOUNTER — Telehealth: Payer: Self-pay | Admitting: Family Medicine

## 2022-12-15 ENCOUNTER — Other Ambulatory Visit: Payer: Self-pay

## 2022-12-15 DIAGNOSIS — S39012A Strain of muscle, fascia and tendon of lower back, initial encounter: Secondary | ICD-10-CM

## 2022-12-15 LAB — CBC
HCT: 40.1 % (ref 36.0–46.0)
Hemoglobin: 12.5 g/dL (ref 12.0–15.0)
MCHC: 31.2 g/dL (ref 30.0–36.0)
MCV: 78.6 fl (ref 78.0–100.0)
Platelets: 302 10*3/uL (ref 150.0–400.0)
RBC: 5.1 Mil/uL (ref 3.87–5.11)
RDW: 16.8 % — ABNORMAL HIGH (ref 11.5–15.5)
WBC: 6 10*3/uL (ref 4.0–10.5)

## 2022-12-15 LAB — IRON,TIBC AND FERRITIN PANEL
%SAT: 13 % (calc) — ABNORMAL LOW (ref 16–45)
Ferritin: 7 ng/mL — ABNORMAL LOW (ref 16–154)
Iron: 51 ug/dL (ref 40–190)
TIBC: 378 mcg/dL (calc) (ref 250–450)

## 2022-12-15 MED ORDER — MELOXICAM 7.5 MG PO TABS
7.5000 mg | ORAL_TABLET | Freq: Every day | ORAL | 0 refills | Status: AC
Start: 2022-12-15 — End: ?

## 2022-12-15 NOTE — Telephone Encounter (Signed)
Medication change  meloxicam (MOBIC) 7.5 MG tablet [[id number]]  methocarbamol (ROBAXIN) 500 MG tablet [[id number]]   Her regular pharmacy is out of these meds. She would like them sent to [facility] on [address]

## 2022-12-29 LAB — ALPHA-THALASSEMIA GENOTYPR

## 2023-01-01 ENCOUNTER — Encounter: Payer: Self-pay | Admitting: Physical Therapy

## 2023-01-01 ENCOUNTER — Ambulatory Visit: Payer: No Typology Code available for payment source | Attending: Family Medicine | Admitting: Physical Therapy

## 2023-01-01 DIAGNOSIS — M6281 Muscle weakness (generalized): Secondary | ICD-10-CM | POA: Diagnosis present

## 2023-01-01 DIAGNOSIS — M25551 Pain in right hip: Secondary | ICD-10-CM | POA: Insufficient documentation

## 2023-01-01 DIAGNOSIS — M545 Low back pain, unspecified: Secondary | ICD-10-CM | POA: Insufficient documentation

## 2023-01-01 DIAGNOSIS — S39012A Strain of muscle, fascia and tendon of lower back, initial encounter: Secondary | ICD-10-CM | POA: Insufficient documentation

## 2023-01-01 DIAGNOSIS — G8929 Other chronic pain: Secondary | ICD-10-CM | POA: Diagnosis present

## 2023-01-01 NOTE — Therapy (Signed)
OUTPATIENT PHYSICAL THERAPY THORACOLUMBAR EVALUATION   Patient Name: JANAEYA LYSON MRN: [id number] DOB:[birth year], 22 y.o., female Today's Date: 01/01/2023  END OF SESSION:  PT End of Session - 01/01/23 1812     Visit Number 1    Date for PT Re-Evaluation 03/12/23    PT Start Time 1812    PT Stop Time 1838    PT Time Calculation (min) 26 min    Activity Tolerance Patient tolerated treatment well    Behavior During Therapy [facility] for tasks assessed/performed             Past Medical History:  Diagnosis Date   Allergy    Anemia    Anxiety    Cancer (HCC)    lymphocyte predominant Hodgkin's lymphoma   Lymph node enlargement    right side neck   Past Surgical History:  Procedure Laterality Date   lymphoma  04/15/2010   Nodular lymphocyte predominant hodgkin's lymphoma   MASS BIOPSY  02/14/2011   Procedure: NECK MASS BIOPSY;  Surgeon: Drema Halon, MD;  Location: [facility];  Service: ENT;  Laterality: Right;  excision right neck node    Patient Active Problem List   Diagnosis Date Noted   Right-sided low back pain without sciatica 12/14/2022   Periumbilical abdominal pain 10/14/2020   Healthcare maintenance 08/24/2020   History of lymphoma 08/19/2020   Lower abdominal pain 08/19/2020   Iron deficiency anemia 06/01/2017   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 Dr Marvetta Gibbons 11/14/2014   Pollen allergies 08/07/2013   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    PCP: Mliss Sax, MD   REFERRING PROVIDER: Mliss Sax, MD   REFERRING DIAG: [phone] (ICD-10-CM) - Strain of lumbar region, initial encounter   Rationale for Evaluation and Treatment: Rehabilitation  THERAPY DIAG:  Muscle weakness (generalized)  Pain in right hip  Chronic right-sided low back pain without sciatica  ONSET DATE: 12/14/22  SUBJECTIVE:                                                                                                                                                                                            SUBJECTIVE STATEMENT: Patient reports pain in R hip and low back which comes and goes. When present it awakens her and causes severe pain.  PERTINENT HISTORY:  Per referring physician note: njury. 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 No bowel or bladder incontinence. She has been taking her iron tablets intermittently.   PAIN:  Are you having pain?  Yes: NPRS scale: 10/10 Pain location: R low back and hip Pain description: deep, in the bone, burning Aggravating factors: After playing drums Relieving factors: Tylenol  PRECAUTIONS: Fall  RED FLAGS: None   WEIGHT BEARING RESTRICTIONS: No  FALLS:  Has patient fallen in last 6 months? No  LIVING ENVIRONMENT: Lives with: lives with their family Lives in: House/apartment Stairs: No Has following equipment at home: None  OCCUPATION: Lawyer, drummer  PLOF: Independent  PATIENT GOALS: Patient would like to identify the cause of her pain and resolve it.  NEXT MD VISIT: about 6 weeks  OBJECTIVE:  Note: Objective measures were completed at Evaluation unless otherwise noted.  DIAGNOSTIC FINDINGS:  None.  COGNITION: Overall cognitive status: Within functional limits for tasks assessed     SENSATION: WFL  MUSCLE LENGTH: Hamstrings: Right 45 deg; Left 55 deg Thomas test: Right mildly tight deg; Left NT deg  POSTURE: rounded shoulders and weight shift left  PALPATION: No TTP, but patient reports that when the pain is present it is sore in her R lower lumbar paraspinals, QL, and ant hip  LUMBAR ROM: WNL-She reports that her R hip area feels stiff  LOWER EXTREMITY ROM:   R hip ROM mildly limited in all planes of movement, LLE WNL  LOWER EXTREMITY MMT:    MMT Right eval Left eval  Hip  flexion 4 5  Hip extension 4- 4-  Hip abduction 4 4  Hip adduction    Hip internal rotation    Hip external rotation    Knee flexion 4 5  Knee extension 4 5  Ankle dorsiflexion 5 5  Ankle plantarflexion    Ankle inversion    Ankle eversion     (Blank rows = not tested)  LUMBAR SPECIAL TESTS:  Straight leg raise test: Negative, Slump test: Negative, and Single leg stance test: Negative  FUNCTIONAL TESTS:  SLS x > 20 sec on either limb Femoral grind test neg  GAIT: Distance walked: In clinic distances Assistive device utilized: None Level of assistance: Complete Independence Comments: No visible gait deviations.  TODAY'S TREATMENT:                                                                                                                              DATE:  01/01/23 Education    PATIENT EDUCATION:  Education details: POC Person educated: Patient Education method: Explanation Education comprehension: verbalized understanding  HOME EXERCISE PROGRAM: TBD  ASSESSMENT:  CLINICAL IMPRESSION: Patient is a 22 y.o. who was seen today for physical therapy evaluation and treatment for R sided LBP and R ant /lat hip pain. The pain does not radiate. It is intermittent with no known cause of exacerbation. When present it is severe, but was not re-producible today. She does demonstrate some tightness in her R hip and weakness throughout her lower trunk and hips, R weaker than L. She tested negative for femoral grind test. She will benefit from PT  to address her tightness and weakness to improve her stability in her hips and control the pain.  OBJECTIVE IMPAIRMENTS: decreased activity tolerance, difficulty walking, decreased ROM, decreased strength, impaired flexibility, and pain.   ACTIVITY LIMITATIONS: bending, squatting, and sleeping  PARTICIPATION LIMITATIONS: cleaning, occupation, and drumming  PERSONAL FACTORS: Past/current experiences are also affecting patient's  functional outcome.   REHAB POTENTIAL: Good  CLINICAL DECISION MAKING: Stable/uncomplicated  EVALUATION COMPLEXITY: Low   GOALS: Goals reviewed with patient? Yes  SHORT TERM GOALS: Target date: 01/19/23  I with initial HEP Baseline: Goal status: INITIAL LONG TERM GOALS: Target date: 03/12/23  I with final HEP Baseline:  Goal status: INITIAL  2.  Patient will report no episodes of R hip pain x at least 3 weeks Baseline: Up to 10/10 Goal status: INITIAL  3.  Increase R hip ROM to WNL in all planes Baseline: Mildly limited Goal status: INITIAL  4.  Increase R hip and lower body strength to 5/5 Baseline: 4- Goal status: INITIAL PLAN:  PT FREQUENCY: 1-2x/week  PT DURATION: 10 weeks  PLANNED INTERVENTIONS: Therapeutic exercises, Therapeutic activity, Neuromuscular re-education, Balance training, Gait training, Patient/Family education, Self Care, Joint mobilization, Dry Needling, Electrical stimulation, Cryotherapy, Moist heat, Ionotophoresis 4mg /ml Dexamethasone, and Manual therapy.  PLAN FOR NEXT SESSION: Initiate HEP for stretching to hips and lower trunk, and strengthening.   Iona Beard, DPT 01/01/2023, 6:53 PM

## 2023-01-10 ENCOUNTER — Ambulatory Visit: Payer: No Typology Code available for payment source | Admitting: Physical Therapy

## 2023-01-15 ENCOUNTER — Ambulatory Visit: Payer: No Typology Code available for payment source | Admitting: Physical Therapy

## 2023-01-22 ENCOUNTER — Ambulatory Visit: Payer: No Typology Code available for payment source | Admitting: Physical Therapy

## 2023-01-29 ENCOUNTER — Ambulatory Visit: Payer: No Typology Code available for payment source | Admitting: Physical Therapy

## 2023-02-05 ENCOUNTER — Ambulatory Visit: Payer: No Typology Code available for payment source | Admitting: Physical Therapy

## 2023-04-24 ENCOUNTER — Ambulatory Visit: Payer: Self-pay | Admitting: Family Medicine

## 2023-04-24 NOTE — Telephone Encounter (Signed)
  Chief Complaint: Beck pain Symptoms: pain to lower pain that patient feels is getting worse Frequency: pain has been going on for a few years but pain has increased over the last two weeks Pertinent Negatives: Patient denies CP, fever, urinary symptoms Disposition: [] ED /[] Urgent Care (no appt availability in office) / [x] Appointment(In office/virtual)/ []  [place] [facility]/ [] Home Care/ [] Refused Recommended Disposition /[] [facility]/ []  Follow-up with PCP Additional Notes: patient complains of lower back pain that has increased over the last couple of weeks. Patient states she has been dealing with this pain for quite awhile but the pain has gotten worse and she is getting more concerned. Patient states that pain level without medication is 6 out of 10. Per protocol, the recommendation would be for appointment. Appointment made for 04/26/2023 at 4:00 at PCP office. Patient verbalized understanding of plan and all questions answered.    Copied from CRM [id number]. Topic: Clinical - Red Word Triage >> Apr 24, 2023  1:15 PM Deaijah H wrote: Red Word that prompted transfer to Nurse Triage: Back pain getting worse & more frequent Reason for Disposition  Back pain is a chronic symptom (recurrent or ongoing AND present > 4 weeks)  Answer Assessment - Initial Assessment Questions 1. ONSET: "When did the pain begin?"      Initial back pain has been going on for about two years.  2. LOCATION: "Where does it hurt?" (upper, mid or lower back)     Lower right side and more on hip area 3. SEVERITY: "How bad is the pain?"  (e.g., Scale 1-10; mild, moderate, or severe)   - MILD (1-3): Doesn't interfere with normal activities.    - MODERATE (4-7): Interferes with normal activities or awakens from sleep.    - SEVERE (8-10): Excruciating pain, unable to do any normal activities.      6 out of 10 without medication 4. PATTERN: "Is the pain constant?" (e.g., yes, no; constant, intermittent)       intermittent 5. RADIATION: "Does the pain shoot into your legs or somewhere else?"     Pain radiates to right hip but doesn't move anywhere else. 6. CAUSE:  "What do you think is causing the back pain?"      Unsure of what might be causing the pain 7. BACK OVERUSE:  "Any recent lifting of heavy objects, strenuous work or exercise?"     Patient plays drums 8. MEDICINES: "What have you taken so far for the pain?" (e.g., nothing, acetaminophen, NSAIDS)     Tylenol and Advil 9. NEUROLOGIC SYMPTOMS: "Do you have any weakness, numbness, or problems with bowel/bladder control?"     No 10. OTHER SYMPTOMS: "Do you have any other symptoms?" (e.g., fever, abdomen pain, burning with urination, blood in urine)       No 11. PREGNANCY: "Is there any chance you are pregnant?" "When was your last menstrual period?"       No  Protocols used: Back Pain-A-AH

## 2023-04-26 ENCOUNTER — Ambulatory Visit: Payer: No Typology Code available for payment source | Admitting: Family Medicine

## 2023-06-01 ENCOUNTER — Encounter: Payer: Self-pay | Admitting: Family

## 2023-06-01 ENCOUNTER — Ambulatory Visit: Admitting: Family

## 2023-06-01 ENCOUNTER — Ambulatory Visit: Payer: Self-pay | Admitting: Family Medicine

## 2023-06-01 ENCOUNTER — Other Ambulatory Visit: Payer: Self-pay | Admitting: Family

## 2023-06-01 VITALS — BP 134/76 | HR 110 | Ht 67.0 in | Wt 172.8 lb

## 2023-06-01 DIAGNOSIS — M25551 Pain in right hip: Secondary | ICD-10-CM | POA: Diagnosis not present

## 2023-06-01 DIAGNOSIS — M549 Dorsalgia, unspecified: Secondary | ICD-10-CM

## 2023-06-01 NOTE — Telephone Encounter (Signed)
  Chief Complaint: Fall Symptoms: back and hip pain Frequency: Yesterday morning Pertinent Negatives: Patient denies LOC, weakness, numbness, loss of bowel or bladder Disposition: [] ED /[] Urgent Care (no appt availability in office) / [x] Appointment(In office/virtual)/ []  [place] [facility]/ [] Home Care/ [] Refused Recommended Disposition /[] [place] [facility]/ []  Follow-up with PCP Additional Notes: Patient calls reporting fall yesterday from standing position. States she tripped and fell back onto a riser. Denies LOC. Per protocol, patient to be evaluated within 4 hours. First available appointment with PCP ior any provider in clinic is outside of guidelines. Patient scheduled with first available provider in alt clinic for today at 1300. Care advice reviewed, patient verbalized understanding and denies further questions at this time. Alerting PCP for review.    Copied from CRM [id number]. Topic: Clinical - Red Word Triage >> Jun 01, 2023  9:07 AM Lennart Pall wrote: Red Word that prompted transfer to Nurse Triage: Patient fell backwards and fell on the ground and has pain on her spine. Lower hip area is swollen Reason for Disposition  [1] MODERATE weakness (i.e., interferes with work, school, normal activities) AND [2] new-onset or worsening  Answer Assessment - Initial Assessment Questions 1. MECHANISM: "How did the fall happen?"     On a riser and fell backwards approx 4 feet 2. DOMESTIC VIOLENCE AND ELDER ABUSE SCREENING: "Did you fall because someone pushed you or tried to hurt you?" If Yes, ask: "Are you safe now?"     Denies 3. ONSET: "When did the fall happen?" (e.g., minutes, hours, or days ago)     Yesterday around 0800 4. LOCATION: "What part of the body hit the ground?" (e.g., back, buttocks, head, hips, knees, hands, head, stomach)     Fell on upper back and R hip 5. INJURY: "Did you hurt (injure) yourself when you fell?" If Yes, ask: "What did you injure? Tell me more about  this?" (e.g., body area; type of injury; pain severity)"     Upper back, R hip 6. PAIN: "Is there any pain?" If Yes, ask: "How bad is the pain?" (e.g., Scale 1-10; or mild,  moderate, severe)   - NONE (0): No pain   - MILD (1-3): Doesn't interfere with normal activities    - MODERATE (4-7): Interferes with normal activities or awakens from sleep    - SEVERE (8-10): Excruciating pain, unable to do any normal activities      With movement 7/10, without movement no pain 7. SIZE: For cuts, bruises, or swelling, ask: "How large is it?" (e.g., inches or centimeters)      Maybe light bruise on hip, hard to see 8. PREGNANCY: "Is there any chance you are pregnant?" "When was your last menstrual period?"     Denies, LMP: will start in 6 days 9. OTHER SYMPTOMS: "Do you have any other symptoms?" (e.g., dizziness, fever, weakness; new onset or worsening).      Denies 10. CAUSE: "What do you think caused the fall (or falling)?" (e.g., tripped, dizzy spell)       Tripped  Protocols used: Falls and [facility]

## 2023-06-01 NOTE — Patient Instructions (Signed)
 Your injuries and symptoms are consistent with soft tissue damage at this time. I think it is reasonable to hold on X-rays today. However, if the symptoms do not improve or resolve by next week, please follow up with Dr. Doreene Burke to get the imaging updated.  You can use the Robaxin and Mobic that you already have as home as we discussed for symptom relief.

## 2023-06-01 NOTE — Telephone Encounter (Signed)
 Noted.

## 2023-06-01 NOTE — Progress Notes (Signed)
 Anna Roman is a 23 y.o. 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 90 tablet 1   ferrous sulfate 220 (44 Fe) MG/5ML solution Take 5 mLs (220 mg total) by mouth daily. 150 mL 3   meloxicam (MOBIC) 7.5 MG tablet Take 1 tablet (7.5 mg total) by mouth daily. 30 tablet 0   methocarbamol (ROBAXIN) 500 MG tablet Take 1 daily for 2 weeks and thereafter daily as needed. 30 tablet 0   No current facility-administered medications for this visit.    Allergies: Codeine, Lactose intolerance (gi), and Azithromycin  Past Medical History:  Diagnosis Date   Allergy    Anemia    Anxiety    Cancer (HCC)    lymphocyte predominant Hodgkin's lymphoma   Lymph node enlargement    right side neck    Past Surgical History:  Procedure Laterality Date   lymphoma  04/15/2010   Nodular lymphocyte predominant hodgkin's lymphoma   MASS BIOPSY  02/14/2011   Procedure: NECK MASS BIOPSY;  Surgeon: Drema Halon, MD;  Location: [facility];  Service: ENT;  Laterality: Right;  excision right neck node     Family History  Problem Relation Age of Onset   Hypertension Mother    Hypertension Father    Diabetes Maternal Grandmother    Hypertension Maternal Grandmother    Heart disease Maternal  Grandfather    Hypertension Paternal Grandmother    Diabetes Paternal Grandmother    Asthma Maternal Uncle    Colon cancer Neg Hx    Rectal cancer Neg Hx    Stomach cancer Neg Hx     Social History   Tobacco Use   Smoking status: Never   Smokeless tobacco: Never  Substance Use Topics   Alcohol use: Yes    Alcohol/week: 1.0 standard drink of alcohol    Types: 1 Glasses of wine per week    Comment: social    Subjective:   Seen as U/C visit today- her regular PCP is not available; tripped and fell yesterday- landed on left side- has bruise on left side; "concerned about spine." Notes that she is not necessarily interested in x-rays at this time- "just wanted to get checked out." Notes that she is feeling better today; did take some Tylenol earlier today; no difficulty breathing/ has not coughed up blood;   Objective:  Vitals:   06/01/23 1312  BP: 134/76  Pulse: (!) 110  SpO2: 96%  Weight: 172 lb 12.8 oz (78.4 kg)  Height: 5\' 7"  (1.702 m)    General: Well developed, well nourished, in no acute distress  Skin : Warm and dry.  Head: Normocephalic and atraumatic  Lungs: Respirations unlabored; clear to auscultation bilaterally without wheeze, rales, rhonchi  Abdomen: Soft; nontender; nondistended; normoactive bowel sounds; no masses or hepatosplenomegaly  Musculoskeletal: No deformities; no active  joint inflammation  Extremities: No edema, cyanosis, clubbing  Vessels: Symmetric bilaterally  Neurologic: Alert and oriented; speech intact; face symmetrical; moves all extremities well; CNII-XII intact without focal deficit   Assessment:  1. Upper back pain   2. Right hip pain     Plan:  Physical exam is reassuring- patient notes she is feeling better today; she defers X-rays at this time which is reasonable; she does have Mobic and Robaxin at home and can use this for symptom relief; if her symptoms persist or don't improve, she is to follow up with her PCP next week.  Work note  given for today as requested;   No follow-ups on file.  No orders of the defined types were placed in this encounter.   Requested Prescriptions    No prescriptions requested or ordered in this encounter

## 2023-06-12 ENCOUNTER — Telehealth: Payer: Self-pay

## 2023-06-12 NOTE — Telephone Encounter (Signed)
 Copied from CRM [id number]. Topic: Appointments - Appointment Scheduling >> Jun 12, 2023  1:46 PM Alcus Dad wrote: Patient/patient representative is calling to schedule an appointment. Needs xray on hip and back

## 2023-06-12 NOTE — Telephone Encounter (Signed)
 I called and spoke with patient and she is requesting an xray of hip and back. I told patient that unfortunately that we do no have xray but could be referred out to have an xray after examination. Patient wanted to proceed and schedule an appointment to see Dr. Doreene Burke.

## 2023-06-19 ENCOUNTER — Ambulatory Visit: Admitting: Family Medicine

## 2023-06-21 ENCOUNTER — Ambulatory Visit: Admitting: Family Medicine

## 2023-06-21 ENCOUNTER — Encounter: Payer: Self-pay | Admitting: Family Medicine

## 2023-06-21 VITALS — BP 108/76 | HR 96 | Temp 97.1°F | Ht 67.0 in | Wt 170.4 lb

## 2023-06-21 DIAGNOSIS — M545 Low back pain, unspecified: Secondary | ICD-10-CM | POA: Diagnosis not present

## 2023-06-21 MED ORDER — PREDNISONE 10 MG (21) PO TBPK: ORAL_TABLET | ORAL | 0 refills | Status: AC

## 2023-06-21 NOTE — Progress Notes (Signed)
 Established Patient Office Visit   Subjective:  Patient ID: Anna Roman, female    DOB: June 25, 2000  Age: 23 y.o. MRN: [id number]  Chief Complaint  Patient presents with   Back Pain    Back pain right lower and right hip. Pain has increased medications is not helping because she belives the pain is coming from her joints.     Back Pain Pertinent negatives include no abdominal pain, tingling or weakness.   Encounter Diagnoses  Name Primary?   Right-sided low back pain without sciatica, unspecified chronicity Yes   Ongoing right lower back pain over the last several months that sometimes radiates to the front of her pelvis.  She also experiences pains in her anterior right pelvis periodically.  She works as a Surveyor, minerals.  She did go to physical therapy once but there was no follow-up.  Denies paresthesias or weakness in the extremities.  No bowel or bladder incontinence.  History of lymphoma.   Review of Systems  Constitutional: Negative.   HENT: Negative.    Eyes:  Negative for blurred vision, discharge and redness.  Respiratory: Negative.    Cardiovascular: Negative.   Gastrointestinal:  Negative for abdominal pain.  Genitourinary: Negative.   Musculoskeletal:  Positive for back pain. Negative for myalgias.  Skin:  Negative for rash.  Neurological:  Negative for tingling, loss of consciousness and weakness.  Endo/Heme/Allergies:  Negative for polydipsia.     Current Outpatient Medications:    ferrous gluconate (FERGON) 240 (27 FE) MG tablet, Take 1 tablet (240 mg total) by mouth daily., Disp: 90 tablet, Rfl: 1   ferrous sulfate 220 (44 Fe) MG/5ML solution, Take 5 mLs (220 mg total) by mouth daily., Disp: 150 mL, Rfl: 3   meloxicam (MOBIC) 7.5 MG tablet, Take 1 tablet (7.5 mg total) by mouth daily., Disp: 30 tablet, Rfl: 0   methocarbamol (ROBAXIN) 500 MG tablet, Take 1 daily for 2 weeks and thereafter daily as needed., Disp: 30 tablet, Rfl: 0   predniSONE (STERAPRED  UNI-PAK 21 TAB) 10 MG (21) TBPK tablet, Take 6 today, 5 tomorrow, 4 the next day and then 3, 2, 1 and stop, Disp: 21 tablet, Rfl: 0   Objective:     BP 108/76 (BP Location: Right Arm, Cuff Size: Normal)   Pulse 96   Temp (!) 97.1 F (36.2 C)   Ht 5\' 7"  (1.702 m)   Wt 170 lb 6.4 oz (77.3 kg)   LMP 06/02/2023 (Within Days)   SpO2 99%   BMI 26.69 kg/m  Wt Readings from Last 3 Encounters:  06/21/23 170 lb 6.4 oz (77.3 kg)  06/01/23 172 lb 12.8 oz (78.4 kg)  12/14/22 182 lb 3.2 oz (82.6 kg)      Physical Exam Constitutional:      General: She is not in acute distress.    Appearance: Normal appearance. She is not ill-appearing, toxic-appearing or diaphoretic.  HENT:     Head: Normocephalic and atraumatic.     Right Ear: External ear normal.     Left Ear: External ear normal.  Eyes:     General: No scleral icterus.       Right eye: No discharge.        Left eye: No discharge.     Extraocular Movements: Extraocular movements intact.     Conjunctiva/sclera: Conjunctivae normal.  Pulmonary:     Effort: Pulmonary effort is normal. No respiratory distress.  Musculoskeletal:     Lumbar back: No tenderness or bony  tenderness. Normal range of motion. Negative right straight leg raise test and negative left straight leg raise test.     Right hip: Normal range of motion.     Left hip: Normal range of motion.  Skin:    General: Skin is warm and dry.  Neurological:     Mental Status: She is alert and oriented to person, place, and time.  Psychiatric:        Mood and Affect: Mood normal.        Behavior: Behavior normal.      No results found for any visits on 06/21/23.    The ASCVD Risk score (Arnett DK, et al., 2019) failed to calculate for the following reasons:   The 2019 ASCVD risk score is only valid for ages 36 to 72    Assessment & Plan:   Right-sided low back pain without sciatica, unspecified chronicity -     Ambulatory referral to Sports Medicine -     DG  Lumbar Spine Complete; Future -     predniSONE; Take 6 today, 5 tomorrow, 4 the next day and then 3, 2, 1 and stop  Dispense: 21 tablet; Refill: 0    Return if symptoms worsen or fail to improve.  Continue Tylenol for as needed use.  Mliss Sax, MD

## 2023-06-22 ENCOUNTER — Encounter: Payer: Self-pay | Admitting: Family Medicine

## 2023-06-22 ENCOUNTER — Ambulatory Visit (HOSPITAL_BASED_OUTPATIENT_CLINIC_OR_DEPARTMENT_OTHER)
Admission: RE | Admit: 2023-06-22 | Discharge: 2023-06-22 | Disposition: A | Discharge status: Home / Self Care | Source: Ambulatory Visit | Attending: Family Medicine | Admitting: Family Medicine

## 2023-06-22 ENCOUNTER — Ambulatory Visit: Admitting: Sports Medicine

## 2023-06-22 DIAGNOSIS — M545 Low back pain, unspecified: Secondary | ICD-10-CM | POA: Diagnosis present

## 2023-06-26 ENCOUNTER — Encounter: Payer: Self-pay | Admitting: Family Medicine

## 2023-06-26 ENCOUNTER — Ambulatory Visit: Admitting: Family Medicine

## 2023-06-26 ENCOUNTER — Ambulatory Visit
Admission: RE | Admit: 2023-06-26 | Discharge: 2023-06-26 | Disposition: A | Discharge status: Home / Self Care | Source: Ambulatory Visit | Attending: Family Medicine | Admitting: Family Medicine

## 2023-06-26 VITALS — BP 112/74 | Ht 67.5 in | Wt 170.0 lb

## 2023-06-26 DIAGNOSIS — C8140 Lymphocyte-rich classical Hodgkin lymphoma, unspecified site: Secondary | ICD-10-CM | POA: Diagnosis not present

## 2023-06-26 DIAGNOSIS — M25551 Pain in right hip: Secondary | ICD-10-CM

## 2023-06-26 NOTE — Assessment & Plan Note (Signed)
 History of lymphoma with chemotherapy x 3, in remission since age 23.  Now with ongoing chronic right anterior hip pain x 2 to 3 years. 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  Pending x-rays may consider MRI to further evaluate -Will continue to follow-up with PCP for ongoing monitoring

## 2023-06-26 NOTE — Progress Notes (Signed)
 DATE OF VISIT: 06/26/2023        DALANEY NEEDLE DOB: 07-07-2000 MRN: [id number]  CC:  Rt hip and low back pain  History- Anna Roman is a 23 y.o.  female for evaluation and treatment of low back pain Initial pain started about 2-3 years ago - no injury/trauma - has been getting progressively worse Pain in the Rt anterior hip - sometimes radiates to the back of the hip and low back Using tylenol prn - helps some Using ibuprofen prn - helps Pain 9-10/10 & crying if not taking medications - gets that severe every few weeks Has tried stretches on her own  - makes it worse Sometimes improves with laying flat on the floor Referred to PT in the past - one session about 4 months ago - did  No radiation of the pain Denies numbness/tingling Denies lower ext weakness Denies changes in bowel or bladder  (+)night pain - sometimes wakes from sleep (+)pain with walking and daily activities  Hx of lymphoma  - chemo x 3 rounds  - in remission since 2013 - follows with PCP Denies F/C/night sweats  Job: Lawyer, is a Surveyor, minerals.  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, returning to [facility] around April 29   Past Medical History Past Medical History:  Diagnosis Date   Allergy    Anemia    Anxiety    Cancer (HCC)    lymphocyte predominant Hodgkin's lymphoma   Lymph node enlargement    right side neck    Past Surgical History Past Surgical History:  Procedure Laterality Date   lymphoma  04/15/2010   Nodular lymphocyte predominant hodgkin's lymphoma   MASS BIOPSY  02/14/2011   Procedure: NECK MASS BIOPSY;  Surgeon: Drema Halon, MD;  Location: [facility];  Service: ENT;  Laterality: Right;  excision right neck node     Medications Current Outpatient Medications  Medication Sig Dispense Refill   ferrous gluconate (FERGON) 240 (27 FE) MG tablet Take 1 tablet (240 mg total) by mouth daily. 90 tablet 1    ferrous sulfate 220 (44 Fe) MG/5ML solution Take 5 mLs (220 mg total) by mouth daily. 150 mL 3   meloxicam (MOBIC) 7.5 MG tablet Take 1 tablet (7.5 mg total) by mouth daily. 30 tablet 0   methocarbamol (ROBAXIN) 500 MG tablet Take 1 daily for 2 weeks and thereafter daily as needed. 30 tablet 0   predniSONE (STERAPRED UNI-PAK 21 TAB) 10 MG (21) TBPK tablet Take 6 today, 5 tomorrow, 4 the next day and then 3, 2, 1 and stop 21 tablet 0   No current facility-administered medications for this visit.    Allergies is allergic to codeine, lactose intolerance (gi), and azithromycin.  Family History - reviewed per EMR and intake form  Social History   reports current alcohol use of about 1.0 standard drink of alcohol per week.  reports that she has never smoked. She has never used smokeless tobacco.  reports no history of drug use. OCCUPATION: substitute teacher - music/drums   EXAM: Vitals: BP 112/74   Ht 5' 7.5" (1.715 m)   Wt 170 lb (77.1 kg)   LMP 06/02/2023 (Within Days)   BMI 26.23 kg/m  General: AOx3, NAD, pleasant SKIN: no rashes or lesions, skin clean, dry, intact MSK: L-spine: No gross deformity.  No scoliosis.  No midline or paraspinal tenderness.  Full range of motion without pain.  Able to toe walk and heel walk.  Negative straight leg raise bilaterally, but does have tight hamstrings bilaterally.  Hip: Right hip with full range of motion with mild pain at terminal flexion.  Slight discomfort at terminal internal rotation as well.  Mildly positive FADIR, negative FABER.  This over the anterior, lateral, posterior hip.  Hip strength 5 -/5 throughout. Left hip with full range of motion without pain or weakness.  Negative FABER, negative FADIR. No leg length differences Walking without a limp  NEURO: sensation intact to light touch, DTR + 2/4 Achilles and patella bilaterally VASC: pulses 2+ and symmetric DP/PT bilaterally, no edema  IMAGING: L-spine XR AP/lateral/oblique/L5  spot film 06/22/2023 personally reviewed and interpreted by me today showing: -No acute bony abnormalities  CT abdomen pelvis with contrast 04/20/2022 for abdominal pain personally reviewed and interpreted by me today showing: -Left hip joint showing no acute abnormality. -Normal-appearing lumbar spine -Otherwise no other acute abnormalities  Assessment & Plan Right hip pain Acute on chronic right anterior hip pain that occasionally radiates to the posterior hip, ongoing for 2 to 3 years, limited improvement with prior physician directed therapy for 6+ weeks.  Limited improvement with NSAIDs. -DDx: FAI, labral tear, osteoarthritis, or injury  Plan: -Prior imaging studies reviewed as noted above -Prior visit note with PCP Dr. Doreene Burke from 06/21/2023 reviewed during the visit today.  Given prescription for prednisone taper which she did not pick up.  Also sent for x-rays as noted above.  These were reviewed with her today -Symptoms and history seem most consistent with hip pathology, therefore we will get right hip and pelvis x-ray to rule out bony abnormalities.  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  Explained how to take medication, common side effects. -Will reach out with results and discuss further treatment Lymphocyte-rich Hodgkin lymphoma, unspecified body region [facility]) History of lymphoma with chemotherapy x 3, in remission since age 23.  Now with ongoing chronic right anterior hip pain x 2 to 3 years. 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  Pending x-rays may consider MRI to further evaluate -Will continue to follow-up with PCP for ongoing monitoring  Patient  expressed understanding & agreement with above.  Encounter Diagnoses  Name Primary?   Right hip pain Yes   Lymphocyte-rich Hodgkin lymphoma, unspecified body region (HCC)     Orders Placed This Encounter  Procedures   DG HIP UNILAT WITH PELVIS 2-3 VIEWS RIGHT    Orders Placed This Encounter  Procedures   DG HIP UNILAT WITH PELVIS 2-3 VIEWS RIGHT

## 2023-06-27 ENCOUNTER — Encounter: Payer: Self-pay | Admitting: Family Medicine

## 2023-06-27 ENCOUNTER — Other Ambulatory Visit: Payer: Self-pay

## 2023-06-27 DIAGNOSIS — M25551 Pain in right hip: Secondary | ICD-10-CM

## 2023-06-27 NOTE — Progress Notes (Signed)
 X-rays reviewed, no abnormalities.  Recommend MRI for further evaluation.  She has had ongoing symptoms for 2 to 3 years that have been progressively worsening.  Negative x-ray, limited improvement with prior physician directed therapy for 6+ weeks.  Limited improvement with NSAIDs.  Also has history of lymphoma.  Recommend MRI right hip to rule out bony abnormality, soft tissue abnormality, labral tear.  Follow-up pending MRI results.  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

## 2023-08-31 ENCOUNTER — Ambulatory Visit: Payer: Self-pay

## 2023-08-31 NOTE — Telephone Encounter (Signed)
 FYI Only or Action Required?: FYI only for provider  Patient was last seen in primary care on 06/21/2023 by Tonna Frederic, MD. Called Nurse Triage reporting Hip Pain. Symptoms began several months ago. Interventions attempted: Other: Unknown. Symptoms are: gradually worsening.  Triage Disposition: No disposition on file.  Patient/caregiver understands and will follow disposition?:       Copied from CRM [id number]. Topic: Clinical - Red Word Triage >> Aug 31, 2023  8:05 AM Howard Macho wrote: Reason for CRM: patient called stating the doctor prescribe her some medication for her hip pain a few weeks and she never picked up the medicine so the patient want to be prescribed the medication again. Patient did not know the name of the medication. Patient state she is having really bad hip pain Reason for Disposition  Health Information question, no triage required and triager able to answer question  Answer Assessment - Initial Assessment Questions 1. REASON FOR CALL or QUESTION: "What is your reason for calling today?" or "How can I best help you?" or "What question do you have that I can help answer?"     Prednisone  refill   Pt did not pick up Prednisone  from 03/25 fill, pt offered appt/triage and declines. Advised she will try Prednisone  and call back if additional eval is needed. This RN educated pt on home care, new-worsening symptoms, when to call back/seek emergent care. Pt verbalized understanding and agrees to plan.  Protocols used: Information Only Call - No Triage-A-AH
